# Patient Record
Sex: Male | Born: 1990 | Hispanic: Yes | Marital: Married | Smoking: Current every day smoker
Health system: Southern US, Community
[De-identification: ages and names within clinical notes are randomized; demographics above are authoritative.]

## PROBLEM LIST (undated history)

## (undated) DIAGNOSIS — L818 Other specified disorders of pigmentation: Secondary | ICD-10-CM

## (undated) DIAGNOSIS — J069 Acute upper respiratory infection, unspecified: Secondary | ICD-10-CM

## (undated) DIAGNOSIS — G8929 Other chronic pain: Secondary | ICD-10-CM

## (undated) DIAGNOSIS — F419 Anxiety disorder, unspecified: Secondary | ICD-10-CM

## (undated) DIAGNOSIS — Z8614 Personal history of Methicillin resistant Staphylococcus aureus infection: Secondary | ICD-10-CM

## (undated) DIAGNOSIS — Z9889 Other specified postprocedural states: Secondary | ICD-10-CM

## (undated) DIAGNOSIS — M4317 Spondylolisthesis, lumbosacral region: Secondary | ICD-10-CM

## (undated) DIAGNOSIS — F329 Major depressive disorder, single episode, unspecified: Secondary | ICD-10-CM

## (undated) DIAGNOSIS — K219 Gastro-esophageal reflux disease without esophagitis: Secondary | ICD-10-CM

## (undated) DIAGNOSIS — F259 Schizoaffective disorder, unspecified: Secondary | ICD-10-CM

## (undated) DIAGNOSIS — F32A Depression, unspecified: Secondary | ICD-10-CM

## (undated) DIAGNOSIS — F429 Obsessive-compulsive disorder, unspecified: Secondary | ICD-10-CM

## (undated) DIAGNOSIS — F250 Schizoaffective disorder, bipolar type: Secondary | ICD-10-CM

## (undated) DIAGNOSIS — M19032 Primary osteoarthritis, left wrist: Secondary | ICD-10-CM

## (undated) HISTORY — DX: Anxiety disorder, unspecified: F41.9

## (undated) HISTORY — PX: LASIK: SHX215

## (undated) HISTORY — PX: WRIST SURGERY: SHX841

## (undated) HISTORY — PX: INGUINAL HERNIA REPAIR: SHX194

---

## 2013-11-01 ENCOUNTER — Other Ambulatory Visit: Payer: Self-pay | Admitting: Orthopedic Surgery

## 2013-11-01 ENCOUNTER — Other Ambulatory Visit (HOSPITAL_COMMUNITY): Payer: Self-pay | Admitting: Orthopedic Surgery

## 2013-11-01 DIAGNOSIS — M19039 Primary osteoarthritis, unspecified wrist: Secondary | ICD-10-CM

## 2013-11-06 ENCOUNTER — Ambulatory Visit (HOSPITAL_COMMUNITY)
Admission: RE | Admit: 2013-11-06 | Discharge: 2013-11-06 | Disposition: A | Discharge status: Home / Self Care | Source: Ambulatory Visit | Attending: Orthopedic Surgery | Admitting: Orthopedic Surgery

## 2013-11-06 DIAGNOSIS — M21939 Unspecified acquired deformity of unspecified forearm: Secondary | ICD-10-CM | POA: Insufficient documentation

## 2013-11-06 DIAGNOSIS — M19239 Secondary osteoarthritis, unspecified wrist: Secondary | ICD-10-CM | POA: Insufficient documentation

## 2013-11-06 DIAGNOSIS — M19039 Primary osteoarthritis, unspecified wrist: Secondary | ICD-10-CM

## 2013-11-09 ENCOUNTER — Other Ambulatory Visit: Payer: Self-pay

## 2013-11-20 DIAGNOSIS — M19032 Primary osteoarthritis, left wrist: Secondary | ICD-10-CM

## 2013-11-20 HISTORY — DX: Primary osteoarthritis, left wrist: M19.032

## 2013-11-21 ENCOUNTER — Emergency Department (HOSPITAL_BASED_OUTPATIENT_CLINIC_OR_DEPARTMENT_OTHER)
Admission: EM | Admit: 2013-11-21 | Discharge: 2013-11-21 | Disposition: A | Discharge status: Other Acute Inpatient Hospital | Attending: Emergency Medicine | Admitting: Emergency Medicine

## 2013-11-21 ENCOUNTER — Encounter (HOSPITAL_COMMUNITY): Payer: Self-pay | Admitting: Behavioral Health

## 2013-11-21 ENCOUNTER — Encounter (HOSPITAL_BASED_OUTPATIENT_CLINIC_OR_DEPARTMENT_OTHER): Payer: Self-pay | Admitting: Emergency Medicine

## 2013-11-21 ENCOUNTER — Inpatient Hospital Stay (HOSPITAL_COMMUNITY)
Admission: AD | Admit: 2013-11-21 | Discharge: 2013-11-26 | DRG: 885 | Disposition: A | Discharge status: Home / Self Care | Source: Intra-hospital | Attending: Psychiatry | Admitting: Psychiatry

## 2013-11-21 DIAGNOSIS — Z79899 Other long term (current) drug therapy: Secondary | ICD-10-CM

## 2013-11-21 DIAGNOSIS — F429 Obsessive-compulsive disorder, unspecified: Secondary | ICD-10-CM | POA: Diagnosis present

## 2013-11-21 DIAGNOSIS — F329 Major depressive disorder, single episode, unspecified: Secondary | ICD-10-CM | POA: Diagnosis present

## 2013-11-21 DIAGNOSIS — F411 Generalized anxiety disorder: Secondary | ICD-10-CM | POA: Insufficient documentation

## 2013-11-21 DIAGNOSIS — T1491XA Suicide attempt, initial encounter: Secondary | ICD-10-CM

## 2013-11-21 DIAGNOSIS — S51809A Unspecified open wound of unspecified forearm, initial encounter: Secondary | ICD-10-CM | POA: Insufficient documentation

## 2013-11-21 DIAGNOSIS — X789XXA Intentional self-harm by unspecified sharp object, initial encounter: Secondary | ICD-10-CM | POA: Insufficient documentation

## 2013-11-21 DIAGNOSIS — F259 Schizoaffective disorder, unspecified: Secondary | ICD-10-CM | POA: Diagnosis present

## 2013-11-21 DIAGNOSIS — Z7289 Other problems related to lifestyle: Secondary | ICD-10-CM

## 2013-11-21 DIAGNOSIS — S41112A Laceration without foreign body of left upper arm, initial encounter: Secondary | ICD-10-CM

## 2013-11-21 DIAGNOSIS — F333 Major depressive disorder, recurrent, severe with psychotic symptoms: Principal | ICD-10-CM | POA: Diagnosis present

## 2013-11-21 DIAGNOSIS — F410 Panic disorder [episodic paroxysmal anxiety] without agoraphobia: Secondary | ICD-10-CM | POA: Diagnosis present

## 2013-11-21 DIAGNOSIS — Z87891 Personal history of nicotine dependence: Secondary | ICD-10-CM | POA: Insufficient documentation

## 2013-11-21 DIAGNOSIS — S41111A Laceration without foreign body of right upper arm, initial encounter: Secondary | ICD-10-CM

## 2013-11-21 DIAGNOSIS — F3289 Other specified depressive episodes: Secondary | ICD-10-CM | POA: Insufficient documentation

## 2013-11-21 HISTORY — DX: Schizoaffective disorder, unspecified: F25.9

## 2013-11-21 HISTORY — DX: Anxiety disorder, unspecified: F41.9

## 2013-11-21 HISTORY — DX: Schizoaffective disorder, bipolar type: F25.0

## 2013-11-21 HISTORY — DX: Major depressive disorder, single episode, unspecified: F32.9

## 2013-11-21 HISTORY — DX: Depression, unspecified: F32.A

## 2013-11-21 HISTORY — DX: Obsessive-compulsive disorder, unspecified: F42.9

## 2013-11-21 LAB — COMPREHENSIVE METABOLIC PANEL
Alkaline Phosphatase: 86 U/L (ref 39–117)
BUN: 17 mg/dL (ref 6–23)
CO2: 25 mEq/L (ref 19–32)
Chloride: 100 mEq/L (ref 96–112)
GFR calc Af Amer: >90 mL/min (ref >90)
Glucose, Bld: 134 mg/dL — ABNORMAL HIGH (ref 70–99)
Potassium: 4 mEq/L (ref 3.5–5.1)
Total Bilirubin: 0.3 mg/dL (ref 0.3–1.2)

## 2013-11-21 LAB — CBC WITH DIFFERENTIAL/PLATELET
Basophils Absolute: 0 10*3/uL (ref 0.0–0.1)
Basophils Relative: 0 % (ref 0–1)
Eosinophils Relative: 0 % (ref 0–5)
Lymphocytes Relative: 18 % (ref 12–46)
MCHC: 36.9 g/dL — ABNORMAL HIGH (ref 30.0–36.0)
Neutro Abs: 6.4 10*3/uL (ref 1.7–7.7)
Neutrophils Relative %: 74 % (ref 43–77)
Platelets: 234 10*3/uL (ref 150–400)
RDW: 11.7 % (ref 11.5–15.5)
WBC: 8.6 10*3/uL (ref 4.0–10.5)

## 2013-11-21 LAB — ETHANOL: Alcohol, Ethyl (B): <11 mg/dL (ref 0–11)

## 2013-11-21 LAB — RAPID URINE DRUG SCREEN, HOSP PERFORMED
Amphetamines: NOT DETECTED
Barbiturates: NOT DETECTED
Benzodiazepines: NOT DETECTED
Cocaine: NOT DETECTED

## 2013-11-21 MED ORDER — MAGNESIUM HYDROXIDE 400 MG/5ML PO SUSP: 30.0000 mL | Freq: Every day | ORAL | Status: DC | PRN

## 2013-11-21 MED ORDER — NAPROXEN SODIUM 275 MG PO TABS: 275.0000 mg | ORAL_TABLET | Freq: Three times a day (TID) | ORAL | Status: DC

## 2013-11-21 MED ORDER — ACETAMINOPHEN 325 MG PO TABS: 650.0000 mg | ORAL_TABLET | Freq: Four times a day (QID) | ORAL | Status: DC | PRN

## 2013-11-21 MED ORDER — NAPROXEN 250 MG PO TABS
250.0000 mg | ORAL_TABLET | Freq: Three times a day (TID) | ORAL | Status: DC
  Filled 2013-11-21 (×2): qty 1

## 2013-11-21 MED ORDER — RISPERIDONE 0.5 MG PO TBDP
0.5000 mg | ORAL_TABLET | Freq: Every day | ORAL | Status: DC
  Administered 2013-11-21: 0.5 mg via ORAL
  Filled 2013-11-21 (×4): qty 1

## 2013-11-21 MED ORDER — LORAZEPAM 1 MG PO TABS
1.0000 mg | ORAL_TABLET | Freq: Four times a day (QID) | ORAL | Status: DC | PRN
  Administered 2013-11-21 – 2013-11-26 (×16): 1 mg via ORAL
  Filled 2013-11-21 (×16): qty 1

## 2013-11-21 MED ORDER — LIDOCAINE-EPINEPHRINE (PF) 2 %-1:200000 IJ SOLN
20.0000 mL | Freq: Once | INTRAMUSCULAR | Status: AC
  Administered 2013-11-21: 20 mL via INTRADERMAL
  Filled 2013-11-21: qty 20

## 2013-11-21 MED ORDER — NICOTINE 14 MG/24HR TD PT24
14.0000 mg | MEDICATED_PATCH | Freq: Every day | TRANSDERMAL | Status: DC
  Administered 2013-11-21 – 2013-11-22 (×2): 14 mg via TRANSDERMAL
  Filled 2013-11-21 (×6): qty 1

## 2013-11-21 MED ORDER — ALUM & MAG HYDROXIDE-SIMETH 200-200-20 MG/5ML PO SUSP: 30.0000 mL | ORAL | Status: DC | PRN

## 2013-11-21 MED ORDER — NAPROXEN 250 MG PO TABS
250.0000 mg | ORAL_TABLET | Freq: Three times a day (TID) | ORAL | Status: DC
  Administered 2013-11-21 – 2013-11-25 (×9): 250 mg via ORAL
  Filled 2013-11-21 (×22): qty 1

## 2013-11-21 MED ORDER — ONDANSETRON HCL 8 MG PO TABS: 4.0000 mg | ORAL_TABLET | Freq: Three times a day (TID) | ORAL | Status: DC | PRN

## 2013-11-21 MED ORDER — LORAZEPAM 1 MG PO TABS: 1.0000 mg | ORAL_TABLET | Freq: Three times a day (TID) | ORAL | Status: DC | PRN

## 2013-11-21 MED ORDER — FLUOXETINE HCL 10 MG PO CAPS
10.0000 mg | ORAL_CAPSULE | Freq: Every day | ORAL | Status: DC
  Administered 2013-11-21 – 2013-11-22 (×2): 10 mg via ORAL
  Filled 2013-11-21 (×6): qty 1

## 2013-11-21 MED ORDER — ACETAMINOPHEN 325 MG PO TABS
650.0000 mg | ORAL_TABLET | ORAL | Status: DC | PRN
  Administered 2013-11-21: 650 mg via ORAL
  Filled 2013-11-21: qty 2

## 2013-11-21 MED ORDER — BUPROPION HCL 100 MG PO TABS
100.0000 mg | ORAL_TABLET | ORAL | Status: DC
  Administered 2013-11-22: 100 mg via ORAL
  Filled 2013-11-21 (×4): qty 1

## 2013-11-21 MED ORDER — IBUPROFEN 400 MG PO TABS: 600.0000 mg | ORAL_TABLET | Freq: Three times a day (TID) | ORAL | Status: DC | PRN

## 2013-11-21 NOTE — ED Notes (Addendum)
Lac not repaired by md had steri strips applied w non stick dressing and 2 inch roller gauze,  Brother at bedside

## 2013-11-21 NOTE — ED Notes (Signed)
Pt brought in by family w several lac to bilateral wrist,  Bleeding controlled  States he wanted to harm self

## 2013-11-21 NOTE — ED Notes (Signed)
Sitter at bedside.

## 2013-11-21 NOTE — ED Notes (Signed)
Called TTS states they have quite a few but will get back with [place] as quick as they can to give [place] a time for tele psych assessment Dr Rosalia Hammers notified of this

## 2013-11-21 NOTE — BHH Counselor (Signed)
Writer called and spoke w/ pt's RN Melissa and will conduct TA at 9:15 am. Writer then called EDP Bernette Mayers to discuss pt.   Evette Cristal, [place] Assessment Counselor

## 2013-11-21 NOTE — ED Notes (Signed)
RN at bedside drawing labs pts father and brother in the room as well pt calm and cooperative  Quiet and withdrawn

## 2013-11-21 NOTE — ED Notes (Signed)
Called Karl Cabrera [facility] to request a sitter she states she will try to rearrange and get one for [place] later this morning but will call if she can

## 2013-11-21 NOTE — ED Notes (Signed)
Pt speaking with psych for assessment via telepsych. Sitter remains at bedside.

## 2013-11-21 NOTE — ED Notes (Signed)
[name] at [facility] called stating she will send a per diem sitter at 0700 this am.
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
## 2013-11-21 NOTE — ED Notes (Signed)
Paige with BH called states she will do the tele psych at 9:15
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
## 2013-11-21 NOTE — ED Provider Notes (Signed)
CSN: [id number]     Arrival date & time 11/21/13  0451 History   First MD Initiated Contact with Patient 11/21/13 [id number]     Chief Complaint  Patient presents with  . Extremity Laceration   (Consider location/radiation/quality/duration/timing/severity/associated sxs/prior Treatment) HPI 22 year old male who presents today stating that he has a history of anxiety and depression and cut his wrist just prior to arrival in the shower. He has multiple lacerations to bilateral volar forearms. He states he did this with an exact a knife. He states he is in the shower and they were cleaned immediately. He states exact the knife was clean. He states he has had his tetanus immunizations and he had been in the service. He denies any history of suicidal attempt but has had some coughing episodes before. He states at that time he did this he was feeling like harming himself and was doing it to harm self. He states that he does not feel that way now and does not currently have any plan to harm self. He is under a psychiatrist's care and has a appointment with his mental health provider tomorrow. He has been coming off of Effexor as it was causing some side effects and has been starting Prozac. He denies any previous hospitalizations. Past Medical History  Diagnosis Date  . Anxiety   . Depressed   . Schizo-affective schizophrenia   . OCD (obsessive compulsive disorder)    No past surgical history on file. No family history on file. History  Substance Use Topics  . Smoking status: Former Games developer  . Smokeless tobacco: Former Neurosurgeon  . Alcohol Use: Yes    Review of Systems  All other systems reviewed and are negative.    Allergies  Codeine  Home Medications   Current Outpatient Rx  Name  Route  Sig  Dispense  Refill  . FLUoxetine (PROZAC) 10 MG capsule   Oral   Take 10 mg by mouth daily.          BP 145/95  Pulse 116  Temp(Src) 99.5 F (37.5 C) (Oral)  Resp 20  Ht 5\' 8"  (1.727 m)  Wt 135  lb (61.236 kg)  BMI 20.53 kg/m2  SpO2 99% Physical Exam  Nursing note and vitals reviewed. Constitutional: He is oriented to person, place, and time. He appears well-developed and well-nourished.  HENT:  Head: Normocephalic and atraumatic.  Right Ear: External ear normal.  Left Ear: External ear normal.  Nose: Nose normal.  Mouth/Throat: Oropharynx is clear and moist.  Eyes: Conjunctivae and EOM are normal. Pupils are equal, round, and reactive to light.  Neck: Normal range of motion. Neck supple.  Cardiovascular: Normal rate and normal heart sounds.   Pulmonary/Chest: Effort normal and breath sounds normal.  Abdominal: Soft. Bowel sounds are normal.  Musculoskeletal:       Arms: Multiple lacerations both superficial and deep. 7 lacerations which goes through to the cutaneous material. #1 as distal on the right forearm is approximately 2 cm #2 is the next proximal on the right forearm it is about 2 cm #4 is most proximal on the right forearm and is 4 cm on the left forearm there are 3 deep lacerations with the most distal being 2 cm one more proximal from that is 4 cm and the most proximal is 4 cm. He has full active range of motion of all of his flexors and extensors of his forearm and wrist sensation is intact bilaterally to his fingers and hands radial pulses  are intact.  Neurological: He is alert and oriented to person, place, and time. He has normal reflexes.  Skin: Skin is warm and dry.  Psychiatric: His speech is normal and behavior is normal. Judgment and thought content normal. His affect is blunt. Cognition and memory are normal.    ED Course  LACERATION REPAIR Date/Time: 11/21/2013 5:55 AM Performed by: Hilario Quarry Authorized by: Hilario Quarry Consent: Verbal consent obtained. Risks and benefits: risks, benefits and alternatives were discussed Patient identity confirmed: verbally with patient Time out: Immediately prior to procedure a "time out" was called to verify  the correct patient, procedure, equipment, support staff and site/side marked as required. Body area: upper extremity Location details: right lower arm Laceration length: 4 cm Foreign bodies: no foreign bodies Tendon involvement: none Nerve involvement: none Vascular damage: no Anesthesia: local infiltration Local anesthetic: lidocaine 1% with epinephrine Anesthetic total: 4 ml Patient sedated: no Preparation: Patient was prepped and draped in the usual sterile fashion. Irrigation solution: saline Irrigation method: syringe Amount of cleaning: standard Debridement: none Degree of undermining: none Skin closure: 5-0 Prolene Number of sutures: 6 Technique: simple Approximation: close Approximation difficulty: simple Dressing: 4x4 sterile gauze Patient tolerance: Patient tolerated the procedure well with no immediate complications.  LACERATION REPAIR Date/Time: 11/21/2013 5:57 AM Performed by: Hilario Quarry Authorized by: Hilario Quarry Consent: Verbal consent obtained. Risks and benefits: risks, benefits and alternatives were discussed Patient identity confirmed: verbally with patient Time out: Immediately prior to procedure a "time out" was called to verify the correct patient, procedure, equipment, support staff and site/side marked as required. Body area: upper extremity Location details: left lower arm Laceration length: 10 cm Foreign bodies: no foreign bodies Vascular damage: no Anesthesia: local infiltration Local anesthetic: lidocaine 1% with epinephrine Anesthetic total: 5 ml Patient sedated: no Preparation: Patient was prepped and draped in the usual sterile fashion. Irrigation solution: saline Irrigation method: syringe Amount of cleaning: standard Debridement: none Degree of undermining: none Skin closure: 5-0 Prolene Number of sutures: 16 Technique: running Approximation: close Approximation difficulty: simple Dressing: 4x4 sterile gauze    (including critical care time)   Labs Review Labs Reviewed  URINE RAPID DRUG SCREEN (HOSP PERFORMED)  CBC WITH DIFFERENTIAL  COMPREHENSIVE METABOLIC PANEL  ETHANOL   Imaging Review No results found.  EKG Interpretation   None      .lac  MDM  No diagnosis found. Patient with history of depression and became suicidal knife and cut his wrist. He states he currently does not have a plan and does not wish to harm himself at this moment. Medical clearance labs are being obtained and he will require a psychiatric evaluation. He voices understanding of this however if he decides to leave prior to this I would advised commitment. I have discussed wound care with he and his family and they understand that the sutures should remit be removed in about 8 days.  Awaiting psych evaluation.  Care signed out to Dr. Bernette Mayers.   Hilario Quarry, MD 11/21/13 [id number]

## 2013-11-21 NOTE — ED Notes (Signed)
Spoke with Jamesetta So at [facility] for transport to [facility]

## 2013-11-21 NOTE — BHH Counselor (Signed)
Pt has been accepted to [facility], 504-1 by Shelda Jakes PA-C to Dr. Elsie Saas. Writer notified EDP Bernette Mayers re: pt's acceptance. Writer spoke w/ pt's RN Melissa who will have pt sign support paperwork and fax back. Original support paperwork will be transferred w/ pt.    Evette Cristal, [place] Assessment Counselor

## 2013-11-21 NOTE — ED Notes (Signed)
Family at bedside watching tv with pt. Pt reports pain to his wrist, rates at 7/10. Pt is smiling in nad.

## 2013-11-21 NOTE — Progress Notes (Signed)
Adult Psychoeducational Group Note  Date:  11/21/2013 Time:  8:00 pm  Group Topic/Focus:  Wrap-Up Group:   The focus of this group is to help patients review their daily goal of treatment and discuss progress on daily workbooks.  Participation Level:  Active  Participation Quality:  Appropriate and Sharing  Affect:  Appropriate  Cognitive:  Appropriate  Insight: Appropriate  Engagement in Group:  Engaged  Modes of Intervention:  Discussion, Education, Socialization and Support  Additional Comments:  Pt stated that he is in the hospital for an attempted suicide. Pt stated that he is good at making people laugh and his always there for others.   Laural Benes, Blaike Vickers 11/21/2013, 9:13 PM

## 2013-11-21 NOTE — Tx Team (Signed)
Initial Interdisciplinary Treatment Plan  PATIENT STRENGTHS: (choose at least two) Ability for insight Active sense of humor Motivation for treatment/growth  PATIENT STRESSORS: Marital or family conflict Medication change or noncompliance   PROBLEM LIST: Problem List/Patient Goals Date to be addressed Date deferred Reason deferred Estimated date of resolution  Med adjustment 11/21/13     hallucinations 11/21/13     depression                                           DISCHARGE CRITERIA:  Ability to meet basic life and health needs Adequate post-discharge living arrangements Improved stabilization in mood, thinking, and/or behavior Verbal commitment to aftercare and medication compliance  PRELIMINARY DISCHARGE PLAN: Attend aftercare/continuing care group Attend PHP/IOP  PATIENT/FAMIILY INVOLVEMENT: This treatment plan has been presented to and reviewed with the patient, Karl Cabrera, and/or family member. The patient and family have been given the opportunity to ask questions and make suggestions.  Blanca Thornton L 11/21/2013, 3:49 PM
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
## 2013-11-22 DIAGNOSIS — F429 Obsessive-compulsive disorder, unspecified: Secondary | ICD-10-CM | POA: Diagnosis present

## 2013-11-22 DIAGNOSIS — F333 Major depressive disorder, recurrent, severe with psychotic symptoms: Principal | ICD-10-CM | POA: Diagnosis present

## 2013-11-22 DIAGNOSIS — F410 Panic disorder [episodic paroxysmal anxiety] without agoraphobia: Secondary | ICD-10-CM

## 2013-11-22 DIAGNOSIS — F4310 Post-traumatic stress disorder, unspecified: Secondary | ICD-10-CM

## 2013-11-22 DIAGNOSIS — F411 Generalized anxiety disorder: Secondary | ICD-10-CM | POA: Diagnosis present

## 2013-11-22 DIAGNOSIS — R45851 Suicidal ideations: Secondary | ICD-10-CM

## 2013-11-22 DIAGNOSIS — X789XXA Intentional self-harm by unspecified sharp object, initial encounter: Secondary | ICD-10-CM

## 2013-11-22 DIAGNOSIS — F332 Major depressive disorder, recurrent severe without psychotic features: Secondary | ICD-10-CM

## 2013-11-22 DIAGNOSIS — T1491XA Suicide attempt, initial encounter: Secondary | ICD-10-CM | POA: Diagnosis present

## 2013-11-22 MED ORDER — RISPERIDONE 1 MG PO TBDP
1.0000 mg | ORAL_TABLET | Freq: Every day | ORAL | Status: DC
  Administered 2013-11-22 – 2013-11-25 (×4): 1 mg via ORAL
  Filled 2013-11-22 (×6): qty 1

## 2013-11-22 MED ORDER — FLUOXETINE HCL 20 MG PO CAPS
20.0000 mg | ORAL_CAPSULE | Freq: Every day | ORAL | Status: DC
  Administered 2013-11-23 – 2013-11-26 (×4): 20 mg via ORAL
  Filled 2013-11-22 (×6): qty 1

## 2013-11-22 NOTE — BHH Suicide Risk Assessment (Signed)
Suicide Risk Assessment  Admission Assessment     Nursing information obtained from:  Patient Demographic factors:  Male;Caucasian;Unemployed Current Mental Status:  Suicidal ideation indicated by patient;Suicide plan Loss Factors:  Loss of significant relationship Historical Factors:  Prior suicide attempts Risk Reduction Factors:  Positive social support;Living with another person, especially a relative  CLINICAL FACTORS:   Severe Anxiety and/or Agitation Panic Attacks Bipolar Disorder:   Depressive phase Depression:   Hopelessness Impulsivity Recent sense of peace/wellbeing Severe Obsessive-Compulsive Disorder More than one psychiatric diagnosis Previous Psychiatric Diagnoses and Treatments Medical Diagnoses and Treatments/Surgeries  COGNITIVE FEATURES THAT CONTRIBUTE TO RISK:  Closed-mindedness Loss of executive function Polarized thinking Thought constriction (tunnel vision)    SUICIDE RISK:   Moderate:  Frequent suicidal ideation with limited intensity, and duration, some specificity in terms of plans, no associated intent, good self-control, limited dysphoria/symptomatology, some risk factors present, and identifiable protective factors, including available and accessible social support.  PLAN OF CARE: Admitted voluntarily on emergently to [facility] emergency department for depression, panic episode and self-injurious behaviors. Patient needed crisis stabilization, safety margin and medication management.  I certify that inpatient services furnished can reasonably be expected to improve the patient's condition.  Karl Cabrera,JANARDHAHA R. 11/22/2013, 12:21 PM
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
## 2013-11-22 NOTE — [facility] Group Notes (Signed)
[facility] LCSW Group Therapy  Emotional Regulation 1:15 - 2: 30 PM        11/22/2013  2:40 PM   Type of Therapy:  Group Therapy  Participation Level:  Appropriate  Participation Quality:  Appropriate  Affect:  Appropriate  Cognitive:   Appropriate  Insight:  Developing/Improving Engaged  Engagement in Therapy:  Developing/Improving Engaged  Modes of Intervention:  Discussion Exploration Problem-Solving Supportive  Summary of Progress/Problems:  Group topic was emotional regulations.  Patient participated in the discussion and was able to identify an emotion that needed to regulated.  He talked about having problems with anxiety and not feeling safe to leave home. Patient was able to identify approprite coping skills.  Wynn Banker 11/22/2013 2:40 PM

## 2013-11-22 NOTE — Progress Notes (Signed)
Patient ID: Karl Cabrera, male   DOB: 1991-02-09, 22 y.o.   MRN: [id number]  Patient presents with depressed mood and affect. Patient denies SI/HI but endorses A/V hallucinations and states, "I hear and see things sometimes." Patient c/o pain in his bilateral wrists due to cutting and his lower back. Patient is given scheduled Naproxen but states, "it doesn't help." Patient is minimal with Clinical research associate and forwards little.  Patient rates his depression at a 4/10 and his hopelessness at a 10/10 today.   A: Writer gave patient encouragement and support. Before lunch writer changed patient's bandages and checks sites for any sign of infection. Patient's cuts were sutured but patient was picking at one suture. No sign of infection was noted by Clinical research associate.  R: Patient is receptive and cooperative with Clinical research associate. Patient has no questions or concerns at this time. Q15 minute safety checks are maintained and patient is safe at this time.
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
## 2013-11-22 NOTE — Clinical Social Work Note (Signed)
Writer spoke with patient's brother to provide SPE.  Brother is not concerned that patient would harm himself immediately upon discharge but has long term fears for his safety if things do not change.  He shared patient spends all his time at home and has not been seeing a psychiatrist or counselor.  He was informed we have scheduled patient with out outpatient clinic for medication management and counseling. He was also advised writer will follow up with patient regarding MH-IOP.  Brother was pleased as he and father had been looking in to a rehab program for patient.  Writer spoke with patient who is agreeable to MH-IOP.  Writer left for outpatient CM to contact writer with a start date for next week. 
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
## 2013-11-22 NOTE — Progress Notes (Signed)
Patient ID: Karl Cabrera, male   DOB: 1991/01/23, 22 y.o.   MRN: [id number]  D:  Pt stated his day his day was "going good so far".  When asked the circumstances surrounding his adm pt stated that he was in the house playing video games. Stated he started having a panic attack and can't remember what happened after. Stated he woke up and had already cut both his wrists. Pt denied SI during the assessment.  A:  Support and encouragement was offered. 15 min checks continued for safety.  R: Pt remains safe.

## 2013-11-22 NOTE — [facility] Suicide Risk Assessment (Signed)
[facility] INPATIENT:  Family/Significant Other Suicide Prevention Education  Suicide Prevention Education:  Education Completed; Jemario Poitras, Brother, [phone];  has been identified by the patient as the family member/significant other with whom the patient will be residing, and identified as the person(s) who will aid the patient in the event of a mental health crisis (suicidal ideations/suicide attempt).  With written consent from the patient, the family member/significant other has been provided the following suicide prevention education, prior to the and/or following the discharge of the patient.  The suicide prevention education provided includes the following:  Suicide risk factors  Suicide prevention and interventions  National Suicide Hotline telephone number  [facility] assessment telephone number  [facility] Emergency Assistance 911  [facility] and/or Residential Mobile Crisis Unit telephone number  Request made of family/significant other to:  Remove weapons (e.g., guns, rifles, knives), all items previously/currently identified as safety concern.  Brother advised guns are being removed from the home.  Remove drugs/medications (over-the-counter, prescriptions, illicit drugs), all items previously/currently identified as a safety concern.  The family member/significant other verbalizes understanding of the suicide prevention education information provided.  The family member/significant other agrees to remove the items of safety concern listed above.  Wynn Banker 11/22/2013, 2:36 PM

## 2013-11-22 NOTE — Progress Notes (Signed)
[facility] Group Notes:  (Nursing/MHT/Case Management/Adjunct)  Date:  11/22/2013  Time:  9:17 PM  Type of Therapy:  Group Therapy  Participation Level:  Active  Participation Quality:  Appropriate  Affect:  Appropriate  Cognitive:  Appropriate  Insight:  Appropriate  Engagement in Group:  Engaged  Modes of Intervention:  Discussion  Summary of Progress/Problems:The patient was recently arriving to [facility] and said he has had a good day.The patient was apppropriate and engage in group.  Octavio Manns 11/22/2013, 9:17 PM

## 2013-11-22 NOTE — Progress Notes (Signed)
Adult Psychoeducational Group Note  Date:  11/22/2013 Time:  11:00am Group Topic/Focus:  Personal Choices and Values:   The focus of this group is to help patients assess and explore the importance of values in their lives, how their values affect their decisions, how they express their values and what opposes their expression.  Participation Level:  Minimal  Participation Quality:  Appropriate and Attentive  Affect:  Appropriate  Cognitive:  Alert and Appropriate  Insight: Appropriate  Engagement in Group:  Limited  Modes of Intervention:  Discussion and Education  Additional Comments: Pt attended and participated in group. Discussion was on personal development. Question was asked what does personal development mean to you. Pt statted personal development means learning how to deny my urges.  Shelly Bombard D 11/22/2013, 1:39 PM

## 2013-11-22 NOTE — [facility] Group Notes (Signed)
[facility] LCSW Aftercare Discharge Planning Group Note   11/22/2013 11:17 AM    Participation Quality:  Appropraite  Mood/Affect:  Appropriate  Depression Rating:  5  Anxiety Rating:  4  Thoughts of Suicide:  No  Will you contract for safety?   NA  Current AVH:  No  Plan for Discharge/Comments:  Patient attended discharge planning group and actively participated in group.  Patient reports he has not been seen by an outpatient provider since discharging from the [employer] in August of this year.  He is agreeable to follow up with [facility].  CSW provided all participants with daily workbook.   Transportation Means: Patient has transportation.   Supports:  Patient has a support system.   Sevyn Markham, [name]

## 2013-11-23 DIAGNOSIS — F333 Major depressive disorder, recurrent, severe with psychotic symptoms: Principal | ICD-10-CM

## 2013-11-23 MED ORDER — NICOTINE 21 MG/24HR TD PT24
21.0000 mg | MEDICATED_PATCH | Freq: Every day | TRANSDERMAL | Status: DC
Start: 2013-11-23 — End: 2013-11-24
  Administered 2013-11-23 – 2013-11-24 (×2): 21 mg via TRANSDERMAL
  Filled 2013-11-23 (×3): qty 1

## 2013-11-23 NOTE — Progress Notes (Signed)
Adult Psychoeducational Group Note  Date:  11/23/2013 Time:  2:59 PM  Group Topic/Focus:  Overcoming Stress:   The focus of this group is to define stress and help patients assess their triggers.  Participation Level:  Active  Participation Quality:  Appropriate  Affect:  Appropriate  Cognitive:  Appropriate  Insight: Appropriate  Engagement in Group:  Engaged, Lacking and Poor  Modes of Intervention:  Discussion, Education, Socialization and Support  Additional Comments:  Pts discussed stressors in their life and how their body responds to these stressors. Pt stated his stressor is when people don't listen. Pt stated cleaning his house has worked in the past with his stress and that he would like to try scuba diving.  Karl Cabrera 11/23/2013, 2:59 PM

## 2013-11-23 NOTE — Progress Notes (Signed)
D: Pt verbalizes anxiety this evening. Pt's anxiety was decreased with 1 mg of Ativan. Pt reports this drug as effective in managing his anxiety. Pt has been observed interacting appropriately within the milieu. Pt attended group with active participation.  A: Writer administered scheduled and prn medications to pt. Continued support and availability as needed was extended to this pt. Staff continue to monitor pt with q43min checks.  R: No adverse drug reactions noted. Pt receptive to treatment. Pt remains safe at this time.

## 2013-11-23 NOTE — Progress Notes (Signed)
Patient ID: Karl Cabrera, male   DOB: 11-07-1991, 22 y.o.   MRN: [id number]  Thursday 9 AM Group  The focus of this group is to educate the patient on the purpose and policies of crisis stabilization and provide a format to answer questions about their admission.  The group details unit policies and expectations of patients while admitted.  Patient did not attend.

## 2013-11-23 NOTE — Progress Notes (Signed)
[facility] Group Notes:  (Nursing/MHT/Case Management/Adjunct)  Date:  11/23/2013  Time:  2:58 PM  Type of Therapy:  Therapeutic Activity  Participation Level:  Active  Participation Quality:  Appropriate, Attentive and Supportive  Affect:  Appropriate  Cognitive:  Appropriate  Insight:  Appropriate  Engagement in Group:  Engaged and Supportive  Modes of Intervention:  Activity  Summary of Progress/Problems: Pts played a game of Pictionary using coping skills. Pt participated and was engaged.  Caswell Corwin 11/23/2013, 2:58 PM

## 2013-11-23 NOTE — Progress Notes (Signed)
D: Pt is anxious in affect and mood. Pt endorses anxiety at a level six out of ten. Pt's anxiety has been effectively controlled with Ativan. Pt is currently at [employer]. Pt observed interacting appropriately within the milieu.  A: Writer administered scheduled and prn medications to pt. Continued support and availability as needed was extended to this pt. Staff continue to monitor pt with q22min checks.  R: No adverse drug reactions noted. Pt receptive to treatment. Pt remains safe at this time.

## 2013-11-23 NOTE — [facility] Group Notes (Signed)
[facility] LCSW Group Therapy  [facility] 1:15 - 2:30 PM  11/23/2013   Type of Therapy:  Group Therapy  Participation Level: Active  Participation Quality:  Attentive  Affect:  Appropriate  Cognitive:  Appropriate  Insight:  Developing/Improving and Engaged  Engagement in Therapy:  Developing/Improving Engaged  Modes of Intervention:  Discussion, Education, Exploration, Problem-Solving, Rapport Building, Support   Summary of Progress/Problems:  Patient listened attentively to speaker from [employer] [facility].  Patient shared he plans to take advantage of the services offered by [facility].  He advised Clinical research associate after group that he contacted his brother regarding the Family and Friends Support Group.  Wynn Banker 11/23/2013

## 2013-11-23 NOTE — Progress Notes (Addendum)
Patient ID: Karl Cabrera, male   DOB: [birth year], 22 y.o.   MRN: [id number]  D: Patient presents with appropriate to circumstance affect and anxious mood. Patient denies SI/HI and A/V hallucinations. Patient rates his pain at a 4/10 which is his pain goal for the day. Patient requested writer to change his bandages around his wrists. Patient rated his depression at a 4/10 and his hopelessness at a 1/10 today. Patient reports on his daily inventory sheet that after discharge he will, "get on a better schedule."  A: Writer gave encouragement to patient and support. Writer gave patient alcohol swabs to clean affected area, then writer wrapped patient's bilateral wrists. Writer did not note any signs of infection.  R: Patient is cooperative and pleasant with staff but is minimal in interaction. Patient is seen in the milieu periodically. Q15 minute safety checks are maintained.

## 2013-11-23 NOTE — Progress Notes (Signed)
[facility] MD Progress Note  11/23/2013 3:44 PM Karl Cabrera  MRN:  [id number] Subjective:  Karl Cabrera states he is doing ok today. He is sleeping fair, eating well and notes that his depression is 4/10.  His anxiety is moderate. He is eating well. Objective: Well spoken male who is alert oriented and cooperative. He is informative and congenial. Denies SI/HI or AVH. Diagnosis:   DSM5: ADL's:  Intact  Sleep: Fair  Appetite:  Good  Suicidal Ideation:  decreased Homicidal Ideation:  denies AEB (as evidenced by):  Psychiatric Specialty Exam: Review of Systems  Constitutional: Negative.  Negative for fever, chills, weight loss, malaise/fatigue and diaphoresis.  HENT: Negative for congestion and sore throat.   Eyes: Negative for blurred vision, double vision and photophobia.  Respiratory: Negative for cough, shortness of breath and wheezing.   Cardiovascular: Negative for chest pain, palpitations and PND.  Gastrointestinal: Negative for heartburn, nausea, vomiting, abdominal pain, diarrhea and constipation.  Musculoskeletal: Negative for falls, joint pain and myalgias.  Neurological: Negative for dizziness, tingling, tremors, sensory change, speech change, focal weakness, seizures, loss of consciousness, weakness and headaches.  Endo/Heme/Allergies: Negative for polydipsia. Does not bruise/bleed easily.  Psychiatric/Behavioral: Negative for depression, suicidal ideas, hallucinations, memory loss and substance abuse. The patient is not nervous/anxious and does not have insomnia.     Blood pressure 126/86, pulse 118, temperature 97.8 F (36.6 C), temperature source Oral, resp. rate 18, height 5\' 7"  (1.702 m), weight 62.596 kg (138 lb).Body mass index is 21.61 kg/(m^2).  General Appearance: Casual  Eye Contact::  Good  Speech:  Clear and Coherent  Volume:  Normal  Mood:  Anxious and Dysphoric  Affect:  Congruent  Thought Process:  Coherent  Orientation:  Full (Time, Place, and Person)   Thought Content:  WDL  Suicidal Thoughts:  Yes.  without intent/plan  Homicidal Thoughts:  No  Memory:  NA  Judgement:  Poor  Insight:  Shallow  Psychomotor Activity:  Normal  Concentration:  Fair  Recall:  Fair  Akathisia:  No  Handed:  Right  AIMS (if indicated):     Assets:  Communication Skills Desire for Improvement Housing Physical Health  Sleep:  Number of Hours: 6.5   Current Medications: Current Facility-Administered Medications  Medication Dose Route Frequency Provider Last Rate Last Dose  . acetaminophen (TYLENOL) tablet 650 mg  650 mg Oral Q6H PRN Verne Spurr, PA-C      . alum & mag hydroxide-simeth (MAALOX/MYLANTA) 200-200-20 MG/5ML suspension 30 mL  30 mL Oral Q4H PRN Verne Spurr, PA-C      . FLUoxetine (PROZAC) capsule 20 mg  20 mg Oral Daily Nehemiah Settle, MD   20 mg at 11/23/13 0748  . LORazepam (ATIVAN) tablet 1 mg  1 mg Oral Q6H PRN Verne Spurr, PA-C   1 mg at 11/23/13 1317  . magnesium hydroxide (MILK OF MAGNESIA) suspension 30 mL  30 mL Oral Daily PRN Verne Spurr, PA-C      . naproxen (NAPROSYN) tablet 250 mg  250 mg Oral TID WC Nehemiah Settle, MD   250 mg at 11/23/13 1143  . nicotine (NICODERM CQ - dosed in mg/24 hours) patch 21 mg  21 mg Transdermal Daily Nehemiah Settle, MD   21 mg at 11/23/13 7829  . risperiDONE (RISPERDAL M-TABS) disintegrating tablet 1 mg  1 mg Oral QHS Nehemiah Settle, MD   1 mg at 11/22/13 2115    Lab Results: No results found for this or any previous visit (  from the past 48 hour(s)).  Physical Findings: AIMS: Facial and Oral Movements Muscles of Facial Expression: None, normal Lips and Perioral Area: None, normal Jaw: None, normal Tongue: None, normal,Extremity Movements Upper (arms, wrists, hands, fingers): None, normal Lower (legs, knees, ankles, toes): None, normal, Trunk Movements Neck, shoulders, hips: None, normal, Overall Severity Severity of abnormal movements (highest  score from questions above): None, normal Incapacitation due to abnormal movements: None, normal Patient's awareness of abnormal movements (rate only patient's report): No Awareness, Dental Status Current problems with teeth and/or dentures?: No Does patient usually wear dentures?: No  CIWA:    COWS:     Treatment Plan Summary: Daily contact with patient to assess and evaluate symptoms and progress in treatment Medication management  Plan: 1. Continue crisis management and stabilization. 2. Medication management to reduce current symptoms to base line and improve patient's overall level of functioning 3. Treat health problems as indicated. 4. Develop treatment plan to decrease risk of relapse upon discharge and the need for     readmission. 5. Psycho-social education regarding relapse prevention and self care. 6. Health care follow up as needed for medical problems. 7. Continue home medications where appropriate. 8. Disposition in progress.   Medical Decision Making Problem Points:  Established problem, stable/improving (1) Data Points:  Review or order medicine tests (1)  I certify that inpatient services furnished can reasonably be expected to improve the patient's condition.   MASHBURN,NEIL 11/23/2013, 3:44 PM  Reviewed the information documented and agree with the treatment plan.  Koury Roddy,JANARDHAHA R. 11/23/2013 6:13 PM

## 2013-11-24 MED ORDER — NICOTINE POLACRILEX 2 MG MT GUM
CHEWING_GUM | OROMUCOSAL | Status: AC
  Filled 2013-11-24: qty 1

## 2013-11-24 MED ORDER — NICOTINE POLACRILEX 2 MG MT GUM
2.0000 mg | CHEWING_GUM | OROMUCOSAL | Status: DC | PRN
  Administered 2013-11-24 – 2013-11-25 (×2): 2 mg via ORAL
  Filled 2013-11-24: qty 1

## 2013-11-24 NOTE — Progress Notes (Signed)
Adult Psychoeducational Group Note  Date:  11/24/2013 Time:  10:59 PM  Group Topic/Focus:  Wrap-Up Group:   The focus of this group is to help patients review their daily goal of treatment and discuss progress on daily workbooks.  Participation Level:  Active  Participation Quality:  Appropriate  Affect:  Appropriate  Cognitive:  Alert  Insight: Appropriate  Engagement in Group:  Engaged  Modes of Intervention:  Discussion  Additional Comments:  Pt rated his day today a "4". He revealed that he was excited to go home today but some reason that didn't happen. He seemed to be in a good mood during group but he also joked around with another patient while others were talking in the group. Other than that he was alert and engaged during the discussion.  [place] Shi K 11/24/2013, 10:59 PM

## 2013-11-24 NOTE — [facility] Group Notes (Signed)
[facility] LCSW Aftercare Discharge Planning Group Note   11/24/2013 12:53 PM    Participation Quality:  Appropraite  Mood/Affect:  Appropriate  Depression Rating:  3  Anxiety Rating:  5  Thoughts of Suicide:  No  Will you contract for safety?   NA  Current AVH:  No  Plan for Discharge/Comments:  Patient attended discharge planning group and actively participated in group.  Patient to follow up with [facility].  CSW provided all participants with daily workbook.   Transportation Means: Patient has transportation.   Supports:  Patient has a support system.   [name], [name]

## 2013-11-24 NOTE — Tx Team (Signed)
Interdisciplinary Treatment Plan Update   Date Reviewed:  11/24/2013  Time Reviewed:  10:25 AM  Progress in Treatment:   Attending groups: Yes Participating in groups: Yes Taking medication as prescribed: Yes  Tolerating medication: Yes Family/Significant other contact made: No, collateral contact made with brother Patient understands diagnosis: Yes  Discussing patient identified problems/goals with staff: Yes Medical problems stabilized or resolved: Yes Denies suicidal/homicidal ideation: Yes Patient has not harmed self or others: Yes  For review of initial/current patient goals, please see plan of care.  Estimated Length of Stay:  3-5 days  Reasons for Continued Hospitalization:  Anxiety Depression Medication stabilization Suicidal ideation  New Problems/Goals identified:    Discharge Plan or Barriers:   Home with outpatient follow up to be scheduled with [facility]  Additional Comments:  N/A  Attendees:  Patient:  11/24/2013 10:25 AM   Signature: Mervyn Gay, MD 11/24/2013 10:25 AM  Signature:  Verne Spurr, PA 11/24/2013 10:25 AM  Signature: Jonne Ply, RN 11/24/2013 10:25 AM  Signature: 11/24/2013 10:25 AM  Signature:   11/24/2013 10:25 AM  Signature:  Horace Porteous Shawnn Bouillon, LCSW 11/24/2013 10:25 AM  Signature:  Reyes Ivan, LCSW 11/24/2013 10:25 AM  Signature:  11/24/2013 10:25 AM  Signature:   11/24/2013 10:25 AM  Signature: 11/24/2013  10:25 AM  Signature:   Onnie Boer, RN URCM 11/24/2013  10:25 AM  Signature:   11/24/2013  10:25 AM    Scribe for Treatment Team:   Juline Patch,  11/24/2013 10:25 AM

## 2013-11-24 NOTE — Progress Notes (Signed)
D: Patient denies SI at this time, despite his self inflicted injuries PTA.  Pt. Has an anxious mood and affect.  Pt. Limited and guarded with conversation but appears to be minimizing his situation. Despite his denial of SI, patient asked to contract for safety and agrees.  A: Patient given emotional support from RN. Patient encouraged to come to staff with concerns and/or questions. Patient's medication routine continued. Patient's orders and plan of care reviewed.   R: Patient remains appropriate and cooperative. Will continue to monitor patient q15 minutes for safety.

## 2013-11-24 NOTE — Progress Notes (Signed)
Writer has observed patient up in the dayroom laughing and joking with select peers. Patient attended group this evening and participated. Patient had no goal and is hopeful to discharge soon. Patient requested a prn dose of ativan after group and c/o back pain but refused heat pack and tylenol and reported  "none of that stuff worksAir cabin crew encouraged patient to let me know if wanting to try either of these. Safety maintained on unit with 15 min checks and patient currently denies si/hi/a/v hallucinations.

## 2013-11-24 NOTE — Progress Notes (Signed)
Patient ID: Karl Cabrera, male   DOB: 08/12/1991, 22 y.o.   MRN: [id number] [facility] [place] MD Progress Note  11/24/2013 3:57 PM [name]  MRN:  [id number] Subjective:  Patient is doing well and reports no new symptoms. He states he is eating well and sleeping well and attending groups also. He states his medication is helping and he reports no new side effects. We did discuss at length his social anxiety as well as his symptoms. He felt that he is schizophrenic and is fearful of this.  He has heard voices since he was a child. On further discussion the voices were likely dreams as they occurred only at night, only were things that he was frightened of "monsters and zombies".  He also discussed visual hallucinations which again on further discussion were not actual hallucinations but seeing an object in the distance, identifying it as one thing based on the shape, then discovering that it was actually something else when he was close enough to correctly identify it. More of a visual misperception rather than a true hallucination.      He denies having anyone in the family who is schizophrenic, but did have a maternal uncle who killed his wife, as well as a Emergency planning/management officer and then turned the gun on himself. Laquan thought he may have been a schizophrenic but states he was never diagnosed. Diagnosis:   DSM5: ADL's:  Intact  Sleep: Fair  Appetite:  Good  Suicidal Ideation:  decreased Homicidal Ideation:  denies AEB (as evidenced by):  Psychiatric Specialty Exam: Review of Systems  Constitutional: Negative.  Negative for fever, chills, weight loss, malaise/fatigue and diaphoresis.  HENT: Negative for congestion and sore throat.   Eyes: Negative for blurred vision, double vision and photophobia.  Respiratory: Negative for cough, shortness of breath and wheezing.   Cardiovascular: Negative for chest pain, palpitations and PND.  Gastrointestinal: Negative for heartburn, nausea, vomiting, abdominal  pain, diarrhea and constipation.  Musculoskeletal: Negative for falls, joint pain and myalgias.  Neurological: Negative for dizziness, tingling, tremors, sensory change, speech change, focal weakness, seizures, loss of consciousness, weakness and headaches.  Endo/Heme/Allergies: Negative for polydipsia. Does not bruise/bleed easily.  Psychiatric/Behavioral: Negative for depression, suicidal ideas, hallucinations, memory loss and substance abuse. The patient is not nervous/anxious and does not have insomnia.     Blood pressure 128/85, pulse 116, temperature 98.5 F (36.9 C), temperature source Oral, resp. rate 17, height 5\' 7"  (1.702 m), weight 62.596 kg (138 lb).Body mass index is 21.61 kg/(m^2).  General Appearance: Casual  Eye Contact::  Good  Speech:  Clear and Coherent  Volume:  Normal  Mood:  Anxious and Dysphoric  Affect:  Congruent  Thought Process:  Coherent  Orientation:  Full (Time, Place, and Person)  Thought Content:  WDL  Suicidal Thoughts:  Yes.  without intent/plan  Homicidal Thoughts:  No  Memory:  NA  Judgement:  Poor  Insight:  Shallow  Psychomotor Activity:  Normal  Concentration:  Fair  Recall:  Fair  Akathisia:  No  Handed:  Right  AIMS (if indicated):     Assets:  Communication Skills Desire for Improvement Housing Physical Health  Sleep:  Number of Hours: 5.5   Current Medications: Current Facility-Administered Medications  Medication Dose Route Frequency Provider Last Rate Last Dose  . acetaminophen (TYLENOL) tablet 650 mg  650 mg Oral Q6H PRN Verne Spurr, PA-C      . alum & mag hydroxide-simeth (MAALOX/MYLANTA) 200-200-20 MG/5ML suspension 30 mL  30 mL  Oral Q4H PRN Verne Spurr, PA-C      . FLUoxetine (PROZAC) capsule 20 mg  20 mg Oral Daily Nehemiah Settle, MD   20 mg at 11/24/13 0752  . LORazepam (ATIVAN) tablet 1 mg  1 mg Oral Q6H PRN Verne Spurr, PA-C   1 mg at 11/24/13 1454  . magnesium hydroxide (MILK OF MAGNESIA) suspension 30 mL   30 mL Oral Daily PRN Verne Spurr, PA-C      . naproxen (NAPROSYN) tablet 250 mg  250 mg Oral TID WC Nehemiah Settle, MD   250 mg at 11/24/13 0649  . nicotine (NICODERM CQ - dosed in mg/24 hours) patch 21 mg  21 mg Transdermal Daily Nehemiah Settle, MD   21 mg at 11/24/13 0753  . risperiDONE (RISPERDAL M-TABS) disintegrating tablet 1 mg  1 mg Oral QHS Nehemiah Settle, MD   1 mg at 11/23/13 2233    Lab Results: No results found for this or any previous visit (from the past 48 hour(s)).  Physical Findings: AIMS: Facial and Oral Movements Muscles of Facial Expression: None, normal Lips and Perioral Area: None, normal Jaw: None, normal Tongue: None, normal,Extremity Movements Upper (arms, wrists, hands, fingers): None, normal Lower (legs, knees, ankles, toes): None, normal, Trunk Movements Neck, shoulders, hips: None, normal, Overall Severity Severity of abnormal movements (highest score from questions above): None, normal Incapacitation due to abnormal movements: None, normal Patient's awareness of abnormal movements (rate only patient's report): No Awareness, Dental Status Current problems with teeth and/or dentures?: No Does patient usually wear dentures?: No  CIWA:    COWS:     Treatment Plan Summary: Daily contact with patient to assess and evaluate symptoms and progress in treatment Medication management  Plan: 1. Continue crisis management and stabilization. 2. Medication management to reduce current symptoms to base line and improve patient's overall level of functioning 3. Treat health problems as indicated. 4. Develop treatment plan to decrease risk of relapse upon discharge and the need for     readmission. 5. Psycho-social education regarding relapse prevention and self care. 6. Health care follow up as needed for medical problems. 7. Continue home medications where appropriate. 8. Will decrease the ativan to 0.5 TID prn for anxiety with a  goal to d/c this entirely. 9. Strongly feel that Tabb will benefit from the weekend groups provided by Sharee Pimple and [facility] RNs on the [place] and encouraged him to attend. 10. Disposition in progress.   Medical Decision Making Problem Points:  Established problem, stable/improving (1) Data Points:  Review or order medicine tests (1)  I certify that inpatient services furnished can reasonably be expected to improve the patient's condition.  Rona Ravens. Sherryn Pollino RPAC 4:10 PM 11/24/2013

## 2013-11-24 NOTE — [facility] Group Notes (Signed)
[facility] LCSW Group Therapy  11/24/2013 2:22 PM  Type of Therapy:  Group Therapy  Participation Level:  Did Not Attend   Wynn Banker 11/24/2013, 2:22 PM

## 2013-11-25 NOTE — Progress Notes (Signed)
D. Pt has been up and has been visible in milieu this evening, has been attending and participating in various activities. Pt has not verbalized any complaints this evening, spoke about how he is feeling better and spoke about how he anticipates discharge tomorrow afternoon. Pt spoke about wanting to go to college as well as get a job. A. Support and encouragement provided. R. Safety maintained, will continue to monitor.

## 2013-11-25 NOTE — [facility] Group Notes (Signed)
[facility] Group Notes:  (Clinical Social Work)  11/25/2013   3:00-4:00PM  Summary of Progress/Problems:   The main focus of today's process group was for the patient to identify ways in which they have sabotaged their own mental health wellness/recovery.  Motivational interviewing was used to explore the reasons they engage in this behavior, and reasons they may have for wanting to change.  The Stages of Change were explained to the group using a handout, and patients identified where they are with regard to changing self-defeating behaviors.  The patient expressed that he is in the hospital for a suicide attempt, and stated that he slit his wrists.  His self-sabotaging behavior shared today was self-injury, and he stated it makes him feel "self-worth" as long as he can still feel the pain.  He acknowledged several elements which were problems in the self-injurious behavior, including that the self-worth does not last, and that he may accidentally cut too deep and kill himself.  He feels he is in the Preparation Stage of Change.  After group, he asked CSW to make sure appointments for follow-up are set for him, because he is requesting discharge for tomorrow.  He is going to be picked up by father and brother, will be living with the two of them.  Type of Therapy:  Process Group  Participation Level:  Active  Participation Quality:  Attentive and Sharing  Affect:  Flat  Cognitive:  Appropriate and Oriented  Insight:  Developing/Improving  Engagement in Therapy:  Engaged  Modes of Intervention:  Education, Motivational Interviewing   Ambrose Mantle, LCSW 11/25/2013, 4:00pm

## 2013-11-25 NOTE — Progress Notes (Addendum)
Pt stated he is to go home today. He does not endorse any SI or HI and does contract for safety. Pt has lacerations to both wrist which appear without reddness or drainage. Pt has been going to group and has been participating. Pt does minimize his lacerations and his previous suicide attempts. Pt did not want any pain medication for his wrist pain. He has been very active in group and has been attending -He is very pleasant and cooperative.

## 2013-11-25 NOTE — Progress Notes (Signed)
[facility] MD Progress Note  11/25/2013 2:44 PM Karl Cabrera  MRN:  [id number] Subjective:  Patient's sleep was "fine" and appetite is "great", depression is low with no suicidal ideations, anxiety is "manageable" Diagnosis:   DSM5:  Depressive Disorders:  Major Depressive Disorder - Severe (296.23)  Axis I: Anxiety Disorder NOS, Major Depression, Recurrent severe and Obsessive Compulsive Disorder Axis II: Deferred Axis III:  Past Medical History  Diagnosis Date  . Anxiety   . Depressed   . Schizo-affective schizophrenia   . OCD (obsessive compulsive disorder)    Axis IV: other psychosocial or environmental problems, problems related to social environment and problems with primary support group Axis V: 41-50 serious symptoms  ADL's:  Intact  Sleep: Good  Appetite:  Good  Suicidal Ideation:  Denies Homicidal Ideation:  Denies  Psychiatric Specialty Exam: Review of Systems  Constitutional: Negative.   HENT: Negative.   Eyes: Negative.   Respiratory: Negative.   Cardiovascular: Negative.   Gastrointestinal: Negative.   Genitourinary: Negative.   Musculoskeletal: Negative.   Skin: Negative.   Neurological: Negative.   Endo/Heme/Allergies: Negative.   Psychiatric/Behavioral: Positive for depression.    Blood pressure 125/87, pulse 92, temperature 97.9 F (36.6 C), temperature source Oral, resp. rate 14, height 5\' 7"  (1.702 m), weight 62.596 kg (138 lb).Body mass index is 21.61 kg/(m^2).  General Appearance: Casual  Eye Contact::  Fair  Speech:  Normal Rate  Volume:  Normal  Mood:  Anxious and Depressed  Affect:  Congruent  Thought Process:  Coherent  Orientation:  Full (Time, Place, and Person)  Thought Content:  WDL  Suicidal Thoughts:  No  Homicidal Thoughts:  No  Memory:  Immediate;   Fair Recent;   Fair Remote;   Fair  Judgement:  Fair  Insight:  Fair  Psychomotor Activity:  Normal  Concentration:  Fair  Recall:  Fair  Akathisia:  No  Handed:  Right   AIMS (if indicated):     Assets:  Desire for Improvement Resilience Social Support  Sleep:  Number of Hours: 5.5   Current Medications: Current Facility-Administered Medications  Medication Dose Route Frequency Provider Last Rate Last Dose  . acetaminophen (TYLENOL) tablet 650 mg  650 mg Oral Q6H PRN Verne Spurr, PA-C      . alum & mag hydroxide-simeth (MAALOX/MYLANTA) 200-200-20 MG/5ML suspension 30 mL  30 mL Oral Q4H PRN Verne Spurr, PA-C      . FLUoxetine (PROZAC) capsule 20 mg  20 mg Oral Daily Nehemiah Settle, MD   20 mg at 11/25/13 0800  . LORazepam (ATIVAN) tablet 1 mg  1 mg Oral Q6H PRN Verne Spurr, PA-C   1 mg at 11/25/13 1249  . magnesium hydroxide (MILK OF MAGNESIA) suspension 30 mL  30 mL Oral Daily PRN Verne Spurr, PA-C      . naproxen (NAPROSYN) tablet 250 mg  250 mg Oral TID WC Nehemiah Settle, MD   250 mg at 11/25/13 0616  . nicotine polacrilex (NICORETTE) gum 2 mg  2 mg Oral PRN Nehemiah Settle, MD   2 mg at 11/24/13 2133  . risperiDONE (RISPERDAL M-TABS) disintegrating tablet 1 mg  1 mg Oral QHS Nehemiah Settle, MD   1 mg at 11/24/13 2100    Lab Results: No results found for this or any previous visit (from the past 48 hour(s)).  Physical Findings: AIMS: Facial and Oral Movements Muscles of Facial Expression: None, normal Lips and Perioral Area: None, normal Jaw: None, normal Tongue:  None, normal,Extremity Movements Upper (arms, wrists, hands, fingers): None, normal Lower (legs, knees, ankles, toes): None, normal, Trunk Movements Neck, shoulders, hips: None, normal, Overall Severity Severity of abnormal movements (highest score from questions above): None, normal Incapacitation due to abnormal movements: None, normal Patient's awareness of abnormal movements (rate only patient's report): No Awareness, Dental Status Current problems with teeth and/or dentures?: No Does patient usually wear dentures?: No  CIWA:     COWS:     Treatment Plan Summary: Daily contact with patient to assess and evaluate symptoms and progress in treatment Medication management  Plan:  Review of chart, vital signs, medications, and notes. 1-Individual and group therapy 2-Medication management for depression and anxiety:  Medications reviewed with the patient and he stated no untoward effects, no changes made 3-Coping skills for depression, anxiety 4-Continue crisis stabilization and management 5-Address health issues--monitoring vital signs, stable 6-Treatment plan in progress to prevent relapse of depression and anxiety  Medical Decision Making Problem Points:  Established problem, stable/improving (1) and Review of psycho-social stressors (1) Data Points:  Review of medication regiment & side effects (2)  I certify that inpatient services furnished can reasonably be expected to improve the patient's condition.   Nanine Means, PMH-NP   11/25/2013, 2:44 PM

## 2013-11-25 NOTE — Progress Notes (Signed)
Adult Psychoeducational Group Note  Date:  11/25/2013 Time:  8:00 pm  Group Topic/Focus:  Wrap-Up Group:   The focus of this group is to help patients review their daily goal of treatment and discuss progress on daily workbooks.  Participation Level:  Active  Participation Quality:  Appropriate and Sharing  Affect:  Appropriate  Cognitive:  Appropriate  Insight: Appropriate  Engagement in Group:  Engaged  Modes of Intervention:  Discussion, Education, Socialization and Support  Additional Comments:  Pt stated he is the hospital for an attempted suicide. Pt stated that he has learned coping skills that consist of counting to 10 and taking deep breaths.  Karl Cabrera 11/25/2013, 11:12 PM

## 2013-11-25 NOTE — Progress Notes (Signed)
Adult Psychoeducational Group Note  Date:  11/25/2013 Time:  3:56 PM  Group Topic/Focus:  Healthy Communication:   The focus of this group is to discuss communication, barriers to communication, as well as healthy ways to communicate with others.  Participation Level:  Active  Participation Quality:  Appropriate  Affect:  Appropriate  Cognitive:  Appropriate  Insight: Appropriate  Engagement in Group:  Engaged  Modes of Intervention:  Activity and Discussion  Additional Comments:  Pt actively participated in group.   Tora Perches N 11/25/2013, 3:56 PM

## 2013-11-25 NOTE — [facility] Group Notes (Signed)
[facility] Group Notes:  (Nursing/MHT/Case Management/Adjunct)  Date:  11/25/2013  Time:  2:30 PM  Type of Therapy:  Nurse Education  Participation Level:  Active  Participation Quality:  Appropriate  Affect:  Appropriate  Cognitive:  Appropriate  Insight:  Appropriate  Engagement in Group:  Engaged  Modes of Intervention:  Discussion  Summary of Progress/Problems:Pt stated he was trying to get ready for discharge today  Nicole Cella 11/25/2013, 2:30 PM

## 2013-11-26 DIAGNOSIS — F411 Generalized anxiety disorder: Secondary | ICD-10-CM

## 2013-11-26 DIAGNOSIS — F259 Schizoaffective disorder, unspecified: Secondary | ICD-10-CM

## 2013-11-26 MED ORDER — LORAZEPAM 1 MG PO TABS: 1.0000 mg | ORAL_TABLET | Freq: Four times a day (QID) | ORAL | Status: AC | PRN

## 2013-11-26 MED ORDER — RISPERIDONE 1 MG PO TBDP: 1.0000 mg | ORAL_TABLET | Freq: Every day | ORAL | Status: DC

## 2013-11-26 MED ORDER — MIRTAZAPINE 15 MG PO TABS: 15.0000 mg | ORAL_TABLET | Freq: Every day | ORAL | Status: DC

## 2013-11-26 MED ORDER — FLUOXETINE HCL 20 MG PO CAPS: 20.0000 mg | ORAL_CAPSULE | ORAL | Status: DC

## 2013-11-26 MED ORDER — NICOTINE POLACRILEX 2 MG MT GUM
CHEWING_GUM | OROMUCOSAL | Status: AC
  Filled 2013-11-26: qty 1

## 2013-11-26 NOTE — Progress Notes (Signed)
[facility] Adult Case Management Discharge Plan :  Will you be returning to the same living situation after discharge: No.  Will live with brother and father At discharge, do you have transportation home?:Yes,  father and brother to pick up Do you have the ability to pay for your medications:Yes,  no issues  Release of information consent forms completed and in the chart;  Patient's signature needed at discharge.  Patient to Follow up at: Follow-up Information   Follow up with Heron Nay - [facility] On 12/08/2013. (You are scheduled with Forde Radon on Friday, December 08, 2013 at [facility]    Contact information:   [address] [place], [place]   [address]  [phone]      Follow up with Dr. Daleen Bo - [facility] On 01/04/2014. (You are scheduled with Dr. Daleen Bo on Thursday, January 04, 2013 at 10 AM)    Contact information:   [address] [place], [place]   [address]  [phone]      Patient denies SI/HI:   Yes,  Adam[place]    Safety Planning and Suicide Prevention discussed:  Yes,  with weekday CSW  Sarina Ser 11/26/2013, 1:43 PM

## 2013-11-26 NOTE — Progress Notes (Signed)
Nrsg DC  Pt denies SI, HI, and / or presence of audit, vis, tactile halluc. He states he is ready for DC and that " I'm glad I got to go to your group today".  Pt given DC AVS instructions, he states he understands and will comply . He is given prescriptions as well as samples of meds from pharmacy . He completes his AM self inventory rates his depression and hopelessness "2/1". Pt  Is given all belongings previously locked in hi lockwer and then escorted to bldg entrance and DC'd ambulatory to brother's care.

## 2013-11-26 NOTE — [facility] Group Notes (Signed)
[facility] Group Notes: (Clinical Social Work)   11/26/2013      Type of Therapy:  Group Therapy   Participation Level:  Did Not Attend    Ambrose Mantle, LCSW 11/26/2013, 4:41 PM
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
## 2013-11-26 NOTE — [facility] Suicide Risk Assessment (Signed)
Suicide Risk Assessment  Discharge Assessment     Demographic Factors:  Male  Mental Status Per Nursing Assessment::   On Admission:  Suicidal ideation indicated by patient;Suicide plan  Current Mental Status by Physician: Alert oriented, cooperative. Improved insight. Not suicidal. No delusions.  Loss Factors: Decrease in vocational status and Financial problems/change in socioeconomic status  Historical Factors: Impulsivity  Risk Reduction Factors:   Sense of responsibility to family, Positive social support and Positive coping skills or problem solving skills  Continued Clinical Symptoms:  Dysthymia Obsessive-Compulsive Disorder  Cognitive Features That Contribute To Risk:  Closed-mindedness Polarized thinking    Suicide Risk:  Minimal: No identifiable suicidal ideation.  Patients presenting with no risk factors but with morbid ruminations; may be classified as minimal risk based on the severity of the depressive symptoms  Discharge Diagnoses:   AXIS I:  Generalized Anxiety Disorder, Obsessive Compulsive Disorder and Panic Disorder, Major depressive disorder, unspecified AXIS II:  Cluster B Traits AXIS III:   Past Medical History  Diagnosis Date  . Anxiety   . Depressed   . Schizo-affective schizophrenia   . OCD (obsessive compulsive disorder)    AXIS IV:  other psychosocial or environmental problems and problems related to social environment AXIS V:  51-60 moderate symptoms  Plan Of Care/Follow-up recommendations:  Activity:  as tolerated Diet:  regular See Discharge summary for details. Continue Prozac, ativan and risperidone. Follow up as scheduled. Is patient on multiple antipsychotic therapies at discharge:  No   Has Patient had three or more failed trials of antipsychotic monotherapy by history:  No  Recommended Plan for Multiple Antipsychotic Therapies: NA  Mataya Kilduff 11/26/2013, 9:52 AM

## 2013-11-29 NOTE — Progress Notes (Signed)
Patient Discharge Instructions:  Next Level Care Provider Has Access to the EMR, 11/29/13 Records provided to [facility] Outpatient clinic via [facility]/Epic access.  Jerelene Redden, 11/29/2013, 3:04 PM

## 2013-12-08 ENCOUNTER — Ambulatory Visit (HOSPITAL_COMMUNITY): Payer: Self-pay | Admitting: Psychology

## 2013-12-12 ENCOUNTER — Other Ambulatory Visit: Payer: Self-pay | Admitting: Orthopedic Surgery

## 2013-12-19 ENCOUNTER — Encounter (HOSPITAL_BASED_OUTPATIENT_CLINIC_OR_DEPARTMENT_OTHER): Payer: Self-pay | Admitting: *Deleted

## 2013-12-25 NOTE — H&P (Signed)
Karl Cabrera is an 23 y.o. male.   Chief Complaint: c/o painful left wrist HPI:   Karl Cabrera is a 23 year-old right-hand dominant recently retired from the [employer] and now studying at [employer].  He presents for evaluation of a painful left wrist following a significant injury sustained March 21, 2013 while on active duty [place] [employer] [place].  At that time he fell ten feet onto an outstretched left wrist.  He was seen by the orthopaedic physicians in the [employer] and had x-rays which revealed a comminuted fracture of the distal radius.  He was initially advised to consider surgery.  Ultimately it was determined that he would benefit from closed treatment.    He completed his immobilization and went on to therapy.  He has had marked restriction in wrist range of motion and pain with dorsiflexion and extreme palmar flexion since the injury.  He denies significant numbness.  He cannot work out or do significant exertion with load-bearing on the left due to his level of pain. He now seeks an upper extremity orthopaedic consult.      Past Medical History  Diagnosis Date  . Anxiety   . Depressed   . Schizo-affective schizophrenia   . OCD (obsessive compulsive disorder)   . 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 Acid reflux     occasional - no current med.  Marland Kitchen. Spondylolisthesis at L5-S1 level     previous back injury    Past Surgical History  Procedure Laterality Date  . Inguinal hernia repair  age 64 years    History reviewed. No pertinent family history. Social History:  reports that he has been smoking Cigarettes.  He has been smoking about 0.00 packs per day for the past 6 years. He has never used smokeless tobacco. He reports that he drinks alcohol. He reports that he does not use illicit drugs.  Allergies:  Allergies  Allergen Reactions  . Codeine Rash    AS A CHILD    No prescriptions prior to admission    No results found for this or any  previous visit (from the past 48 hour(s)).  No results found.   Pertinent items are noted in HPI.  Height 5' 7.5" (1.715 m), weight 62.596 kg (138 lb).  General appearance: alert Head: Normocephalic, without obvious abnormality Neck: supple, symmetrical, trachea midline Resp: clear to auscultation bilaterally Cardio: regular rate and rhythm GI: normal findings: bowel sounds normal Extremities:.  Inspection of his hand and wrist reveals a mild malunion appearance of the left wrist. His range of motion reveals palmar flexion right wrist 80, left wrist 40 limited by pain, dorsiflexion right wrist 80, and left wrist 40 with severe pain. He has 15 degrees radial deviation right vs. 4 left; 40 degrees ulnar deviation vs. 20 left.  He has full range of motion of his fingers and thumb.  His neurovascular examination is intact.    RADIOGRAPHS:   Four views of the left wrist demonstrate irregularity of the lunate facet of the distal radius, loss of radial slope and length and ventral tilt.   He has a slightly abnormal appearance of scapholunate articulation.  He has a slight VISI posture of his lunate and an ununited ulnar styloid fracture.  There is some narrowing at the distal radial ulnar joint.    Comparison films of the right wrist were reviewed.  These are remarkable for a slight VISI posture of his right lunate.    MRI left  wrist: This was accomplished at [facility] on 11/06/13.  It was interpreted by Dr. Jena Gauss.  He is noted to have secondary arthrosis due to focal cartilage loss at the articular surface fracture in the lunate facet.  There is a 1.2 mm.  step-off.     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    Plan: To the OR for left wrist scope  with arthrodesis of radio-lunate and scapho-lunate and resection of distal pole of the scaphoid. The procedure, risks,benefits and post-op course were discussed with the patient at length and they were in agreement with the plan.  DASNOIT,Chante Mayson J 12/25/2013, 12:34 PM H&P documentation: 12/26/2013  -History and Physical Reviewed  -Patient has been re-examined  -No change in the plan of care  Wyn Forster, MD

## 2013-12-26 ENCOUNTER — Encounter (HOSPITAL_BASED_OUTPATIENT_CLINIC_OR_DEPARTMENT_OTHER): Admitting: Anesthesiology

## 2013-12-26 ENCOUNTER — Ambulatory Visit (HOSPITAL_BASED_OUTPATIENT_CLINIC_OR_DEPARTMENT_OTHER)
Admission: RE | Admit: 2013-12-26 | Discharge: 2013-12-26 | Disposition: A | Discharge status: Home / Self Care | Source: Ambulatory Visit | Attending: Orthopedic Surgery | Admitting: Orthopedic Surgery

## 2013-12-26 ENCOUNTER — Encounter (HOSPITAL_BASED_OUTPATIENT_CLINIC_OR_DEPARTMENT_OTHER)
Admission: RE | Disposition: A | Discharge status: Home / Self Care | Payer: Self-pay | Source: Ambulatory Visit | Attending: Orthopedic Surgery

## 2013-12-26 ENCOUNTER — Encounter (HOSPITAL_BASED_OUTPATIENT_CLINIC_OR_DEPARTMENT_OTHER): Payer: Self-pay | Admitting: *Deleted

## 2013-12-26 ENCOUNTER — Ambulatory Visit (HOSPITAL_BASED_OUTPATIENT_CLINIC_OR_DEPARTMENT_OTHER): Admitting: Anesthesiology

## 2013-12-26 DIAGNOSIS — F411 Generalized anxiety disorder: Secondary | ICD-10-CM | POA: Insufficient documentation

## 2013-12-26 DIAGNOSIS — F429 Obsessive-compulsive disorder, unspecified: Secondary | ICD-10-CM | POA: Insufficient documentation

## 2013-12-26 DIAGNOSIS — IMO0002 Reserved for concepts with insufficient information to code with codable children: Secondary | ICD-10-CM | POA: Insufficient documentation

## 2013-12-26 DIAGNOSIS — Q762 Congenital spondylolisthesis: Secondary | ICD-10-CM | POA: Insufficient documentation

## 2013-12-26 DIAGNOSIS — F259 Schizoaffective disorder, unspecified: Secondary | ICD-10-CM | POA: Insufficient documentation

## 2013-12-26 DIAGNOSIS — F3289 Other specified depressive episodes: Secondary | ICD-10-CM | POA: Insufficient documentation

## 2013-12-26 DIAGNOSIS — K219 Gastro-esophageal reflux disease without esophagitis: Secondary | ICD-10-CM | POA: Insufficient documentation

## 2013-12-26 DIAGNOSIS — F329 Major depressive disorder, single episode, unspecified: Secondary | ICD-10-CM | POA: Insufficient documentation

## 2013-12-26 DIAGNOSIS — M19039 Primary osteoarthritis, unspecified wrist: Secondary | ICD-10-CM | POA: Insufficient documentation

## 2013-12-26 HISTORY — DX: Gastro-esophageal reflux disease without esophagitis: K21.9

## 2013-12-26 HISTORY — DX: Primary osteoarthritis, left wrist: M19.032

## 2013-12-26 HISTORY — PX: WRIST ARTHROSCOPY: SHX838

## 2013-12-26 HISTORY — DX: Spondylolisthesis, lumbosacral region: M43.17

## 2013-12-26 HISTORY — PX: WRIST FUSION: SHX839

## 2013-12-26 LAB — POCT HEMOGLOBIN-HEMACUE: Hemoglobin: 15.6 g/dL (ref 13.0–17.0)

## 2013-12-26 SURGERY — ARTHROSCOPY, WRIST
Anesthesia: General | Site: Wrist | Laterality: Left

## 2013-12-26 MED ORDER — FENTANYL CITRATE 0.05 MG/ML IJ SOLN: 50.0000 ug | INTRAMUSCULAR | Status: DC | PRN

## 2013-12-26 MED ORDER — LIDOCAINE HCL (CARDIAC) 20 MG/ML IV SOLN
INTRAVENOUS | Status: DC | PRN
  Administered 2013-12-26: 100 mg via INTRAVENOUS

## 2013-12-26 MED ORDER — FENTANYL CITRATE 0.05 MG/ML IJ SOLN
INTRAMUSCULAR | Status: AC
  Filled 2013-12-26: qty 2

## 2013-12-26 MED ORDER — DEXAMETHASONE SODIUM PHOSPHATE 10 MG/ML IJ SOLN
INTRAMUSCULAR | Status: DC | PRN
  Administered 2013-12-26: 10 mg via INTRAVENOUS
  Administered 2013-12-26: 4 mg

## 2013-12-26 MED ORDER — MIDAZOLAM HCL 2 MG/2ML IJ SOLN
INTRAMUSCULAR | Status: AC
  Filled 2013-12-26: qty 2

## 2013-12-26 MED ORDER — LACTATED RINGERS IV SOLN
INTRAVENOUS | Status: DC
  Administered 2013-12-26 (×2): via INTRAVENOUS
  Administered 2013-12-26: 20 mL/h via INTRAVENOUS

## 2013-12-26 MED ORDER — MIDAZOLAM HCL 2 MG/2ML IJ SOLN: 1.0000 mg | INTRAMUSCULAR | Status: DC | PRN

## 2013-12-26 MED ORDER — CEFAZOLIN SODIUM-DEXTROSE 2-3 GM-% IV SOLR
2.0000 g | Freq: Once | INTRAVENOUS | Status: AC
  Administered 2013-12-26: 2 g via INTRAVENOUS

## 2013-12-26 MED ORDER — ONDANSETRON HCL 4 MG/2ML IJ SOLN
INTRAMUSCULAR | Status: DC | PRN
Start: 2013-12-26 — End: 2013-12-26
  Administered 2013-12-26: 4 mg via INTRAVENOUS

## 2013-12-26 MED ORDER — MIDAZOLAM HCL 2 MG/2ML IJ SOLN
1.0000 mg | INTRAMUSCULAR | Status: DC | PRN
  Administered 2013-12-26: 2 mg via INTRAVENOUS

## 2013-12-26 MED ORDER — HYDROMORPHONE HCL PF 1 MG/ML IJ SOLN: 0.2500 mg | INTRAMUSCULAR | Status: DC | PRN

## 2013-12-26 MED ORDER — FENTANYL CITRATE 0.05 MG/ML IJ SOLN
INTRAMUSCULAR | Status: AC
  Filled 2013-12-26: qty 4

## 2013-12-26 MED ORDER — MIDAZOLAM HCL 2 MG/ML PO SYRP: 12.0000 mg | ORAL_SOLUTION | Freq: Once | ORAL | Status: DC | PRN

## 2013-12-26 MED ORDER — CEFAZOLIN SODIUM 1-5 GM-% IV SOLN
INTRAVENOUS | Status: AC
  Filled 2013-12-26: qty 100

## 2013-12-26 MED ORDER — HYDROMORPHONE HCL 2 MG PO TABS: ORAL_TABLET | ORAL | Status: DC

## 2013-12-26 MED ORDER — CEPHALEXIN 500 MG PO CAPS: 500.0000 mg | ORAL_CAPSULE | Freq: Three times a day (TID) | ORAL | Status: DC

## 2013-12-26 MED ORDER — CHLORHEXIDINE GLUCONATE 4 % EX LIQD: 60.0000 mL | Freq: Once | CUTANEOUS | Status: DC

## 2013-12-26 MED ORDER — FENTANYL CITRATE 0.05 MG/ML IJ SOLN
50.0000 ug | Freq: Once | INTRAMUSCULAR | Status: AC
  Administered 2013-12-26: 100 ug via INTRAVENOUS

## 2013-12-26 MED ORDER — BUPIVACAINE-EPINEPHRINE PF 0.5-1:200000 % IJ SOLN
INTRAMUSCULAR | Status: DC | PRN
  Administered 2013-12-26: 25 mL

## 2013-12-26 MED ORDER — MIDAZOLAM HCL 5 MG/5ML IJ SOLN
INTRAMUSCULAR | Status: DC | PRN
  Administered 2013-12-26: 2 mg via INTRAVENOUS

## 2013-12-26 MED ORDER — PROPOFOL 10 MG/ML IV BOLUS
INTRAVENOUS | Status: DC | PRN
  Administered 2013-12-26: 200 mg via INTRAVENOUS

## 2013-12-26 MED ORDER — PROMETHAZINE HCL 25 MG/ML IJ SOLN: 6.2500 mg | INTRAMUSCULAR | Status: DC | PRN

## 2013-12-26 SURGICAL SUPPLY — 110 items
BAG DECANTER FOR FLEXI CONT (MISCELLANEOUS) IMPLANT
BANDAGE ADH SHEER 1  50/CT (GAUZE/BANDAGES/DRESSINGS) IMPLANT
BANDAGE ELASTIC 3 VELCRO ST LF (GAUZE/BANDAGES/DRESSINGS) ×6 IMPLANT
BANDAGE ELASTIC 4 VELCRO ST LF (GAUZE/BANDAGES/DRESSINGS) ×3 IMPLANT
BIT DRILL MEMOFIX 1.7 (BIT) ×3 IMPLANT
BLADE AVERAGE 25MMX9MM (BLADE) ×1
BLADE AVERAGE 25X9 (BLADE) ×2 IMPLANT
BLADE MINI RND TIP GREEN BEAV (BLADE) ×3 IMPLANT
BLADE SURG 15 STRL LF DISP TIS (BLADE) ×3 IMPLANT
BLADE SURG 15 STRL SS (BLADE) ×6
BNDG COHESIVE 3X5 TAN STRL LF (GAUZE/BANDAGES/DRESSINGS) IMPLANT
BNDG COHESIVE 4X5 TAN STRL (GAUZE/BANDAGES/DRESSINGS) ×3 IMPLANT
BNDG ESMARK 4X9 LF (GAUZE/BANDAGES/DRESSINGS) ×3 IMPLANT
BNDG GAUZE ELAST 4 BULKY (GAUZE/BANDAGES/DRESSINGS) IMPLANT
BRUSH SCRUB EZ PLAIN DRY (MISCELLANEOUS) ×3 IMPLANT
BUR CUDA 2.9 (BURR) IMPLANT
BUR CUDA 2.9MM (BURR)
BUR EGG/OVAL CARBIDE (BURR) IMPLANT
BUR FULL RADIUS 2.9 (BURR) IMPLANT
BUR FULL RADIUS 2.9MM (BURR)
BUR GATOR 2.9 (BURR) IMPLANT
BUR GATOR 2.9MM (BURR)
BUR SPHERICAL 2.9 (BURR) IMPLANT
BUR SPHERICAL 2.9MM (BURR)
CANISTER SUCT 1200ML W/VALVE (MISCELLANEOUS) IMPLANT
CLOSURE WOUND 1/2 X4 (GAUZE/BANDAGES/DRESSINGS) ×1
CORDS BIPOLAR (ELECTRODE) ×3 IMPLANT
COVER MAYO STAND STRL (DRAPES) ×3 IMPLANT
COVER TABLE BACK 60X90 (DRAPES) ×3 IMPLANT
CUFF TOURNIQUET SINGLE 18IN (TOURNIQUET CUFF) ×3 IMPLANT
DECANTER SPIKE VIAL GLASS SM (MISCELLANEOUS) IMPLANT
DRAPE EXTREMITY T 121X128X90 (DRAPE) ×3 IMPLANT
DRAPE OEC MINIVIEW 54X84 (DRAPES) ×3 IMPLANT
DRAPE SURG 17X23 STRL (DRAPES) ×3 IMPLANT
DRSG EMULSION OIL 3X3 NADH (GAUZE/BANDAGES/DRESSINGS) IMPLANT
GAUZE XEROFORM 1X8 LF (GAUZE/BANDAGES/DRESSINGS) IMPLANT
GLOVE BIO SURGEON STRL SZ 6.5 (GLOVE) ×4 IMPLANT
GLOVE BIO SURGEON STRL SZ7 (GLOVE) ×3 IMPLANT
GLOVE BIO SURGEONS STRL SZ 6.5 (GLOVE) ×2
GLOVE BIOGEL M STRL SZ7.5 (GLOVE) ×3 IMPLANT
GLOVE BIOGEL PI IND STRL 7.0 (GLOVE) ×2 IMPLANT
GLOVE BIOGEL PI IND STRL 7.5 (GLOVE) ×1 IMPLANT
GLOVE BIOGEL PI INDICATOR 7.0 (GLOVE) ×4
GLOVE BIOGEL PI INDICATOR 7.5 (GLOVE) ×2
GLOVE ORTHO TXT STRL SZ7.5 (GLOVE) ×3 IMPLANT
GOWN STRL REUS W/ TWL LRG LVL3 (GOWN DISPOSABLE) ×3 IMPLANT
GOWN STRL REUS W/TWL LRG LVL3 (GOWN DISPOSABLE) ×6
GOWN STRL REUS W/TWL XL LVL3 (GOWN DISPOSABLE) ×6 IMPLANT
IV NS 1000ML (IV SOLUTION) ×2
IV NS 1000ML BAXH (IV SOLUTION) ×1 IMPLANT
IV NS 500ML (IV SOLUTION)
IV NS 500ML BAXH (IV SOLUTION) IMPLANT
IV NS IRRIG 3000ML ARTHROMATIC (IV SOLUTION) IMPLANT
K-WIRE .035X4 (WIRE) ×3 IMPLANT
K-WIRE .045X4 (WIRE) IMPLANT
K-WIRE .062X4 (WIRE) IMPLANT
LOOP VESSEL MAXI BLUE (MISCELLANEOUS) IMPLANT
NDL SAFETY ECLIPSE 18X1.5 (NEEDLE) ×1 IMPLANT
NEEDLE 27GAX1X1/2 (NEEDLE) IMPLANT
NEEDLE HYPO 18GX1.5 SHARP (NEEDLE) ×2
NEEDLE HYPO 22GX1.5 SAFETY (NEEDLE) ×3 IMPLANT
NEEDLE MAYO 6 CRC TAPER PT (NEEDLE) IMPLANT
NEEDLE TUOHY 20GX3.5 (NEEDLE) IMPLANT
NS IRRIG 1000ML POUR BTL (IV SOLUTION) IMPLANT
PACK BASIN DAY SURGERY FS (CUSTOM PROCEDURE TRAY) ×3 IMPLANT
PAD CAST 3X4 CTTN HI CHSV (CAST SUPPLIES) ×2 IMPLANT
PADDING CAST ABS 4INX4YD NS (CAST SUPPLIES)
PADDING CAST ABS COTTON 4X4 ST (CAST SUPPLIES) IMPLANT
PADDING CAST COTTON 3X4 STRL (CAST SUPPLIES) ×4
PASSER SUT SWANSON 36MM LOOP (INSTRUMENTS) IMPLANT
PIN MEMOFIX 107MM (PIN) ×6 IMPLANT
SET SM JOINT TUBING/CANN (CANNULA) ×3 IMPLANT
SLEEVE SCD COMPRESS KNEE MED (MISCELLANEOUS) ×3 IMPLANT
SPLINT PLASTER CAST XFAST 3X15 (CAST SUPPLIES) ×5 IMPLANT
SPLINT PLASTER XTRA FASTSET 3X (CAST SUPPLIES) ×10
SPONGE GAUZE 4X4 12PLY (GAUZE/BANDAGES/DRESSINGS) ×3 IMPLANT
STAPLE MEMOFIX MS 12X15X13 (Staple) ×3 IMPLANT
STAPLE MEMOFIX NIT 12X17X15 (Staple) ×3 IMPLANT
STOCKINETTE 4X48 STRL (DRAPES) ×3 IMPLANT
STRIP CLOSURE SKIN 1/2X4 (GAUZE/BANDAGES/DRESSINGS) ×2 IMPLANT
SUCTION FRAZIER TIP 10 FR DISP (SUCTIONS) ×3 IMPLANT
SUT ETHIBOND 3-0 V-5 (SUTURE) ×3 IMPLANT
SUT ETHILON 5 0 P 3 18 (SUTURE)
SUT FIBERWIRE 2-0 18 17.9 3/8 (SUTURE)
SUT FIBERWIRE 3-0 18 TAPR NDL (SUTURE)
SUT MERSILENE 4 0 P 3 (SUTURE) IMPLANT
SUT NYLON ETHILON 5-0 P-3 1X18 (SUTURE) IMPLANT
SUT PROLENE 3 0 PS 2 (SUTURE) IMPLANT
SUT STEEL 0 (SUTURE)
SUT STEEL 0 18XMFL TIE 17 (SUTURE) IMPLANT
SUT STEEL 3 0 (SUTURE) IMPLANT
SUT VIC AB 0 CT1 27 (SUTURE)
SUT VIC AB 0 CT1 27XBRD ANBCTR (SUTURE) IMPLANT
SUT VIC AB 2-0 PS2 27 (SUTURE) IMPLANT
SUT VIC AB 2-0 SH 27 (SUTURE)
SUT VIC AB 2-0 SH 27XBRD (SUTURE) IMPLANT
SUT VIC AB 3-0 FS2 27 (SUTURE) IMPLANT
SUT VIC AB 4-0 P-3 18XBRD (SUTURE) IMPLANT
SUT VIC AB 4-0 P3 18 (SUTURE)
SUT VICRYL 4-0 PS2 18IN ABS (SUTURE) IMPLANT
SUTURE FIBERWR 2-0 18 17.9 3/8 (SUTURE) IMPLANT
SUTURE FIBERWR 3-0 18 TAPR NDL (SUTURE) IMPLANT
SYR 3ML 23GX1 SAFETY (SYRINGE) IMPLANT
SYR BULB 3OZ (MISCELLANEOUS) IMPLANT
SYR CONTROL 10ML LL (SYRINGE) ×3 IMPLANT
TRAY DSU PREP LF (CUSTOM PROCEDURE TRAY) ×3 IMPLANT
TUBE CONNECTING 20'X1/4 (TUBING) ×1
TUBE CONNECTING 20X1/4 (TUBING) ×2 IMPLANT
UNDERPAD 30X30 INCONTINENT (UNDERPADS AND DIAPERS) ×3 IMPLANT
WATER STERILE IRR 1000ML POUR (IV SOLUTION) ×3 IMPLANT

## 2013-12-26 NOTE — Progress Notes (Signed)
Assisted Dr. Kasik with left, ultrasound guided, supraclavicular block. Side rails up, monitors on throughout procedure. See vital signs in flow sheet. Tolerated Procedure well. 

## 2013-12-26 NOTE — Transfer of Care (Signed)
Immediate Anesthesia Transfer of Care Note  Patient: Karl Cabrera  Procedure(s) Performed: Procedure(s): ARTHROSCOPY WRIST WITH FUSION RADIO LUNATE, FUSION SCAPHOLUNATE, RESECTION DISTAL POLE OF SCAPHOID (Left) ARTHRODESIS WRIST (Left)  Patient Location: PACU  Anesthesia Type:GA combined with regional for post-op pain  Level of Consciousness: awake, oriented and patient cooperative  Airway & Oxygen Therapy: Patient Spontanous Breathing and Patient connected to face mask oxygen  Post-op Assessment: Report given to PACU RN and Post -op Vital signs reviewed and stable  Post vital signs: Reviewed and stable  Complications: No apparent anesthesia complications

## 2013-12-26 NOTE — Anesthesia Postprocedure Evaluation (Signed)
  Anesthesia Post-op Note  Patient: Karl Cabrera  Procedure(s) Performed: Procedure(s): ARTHROSCOPY WRIST WITH FUSION RADIO LUNATE, FUSION SCAPHOLUNATE, RESECTION DISTAL POLE OF SCAPHOID (Left) ARTHRODESIS WRIST (Left)  Patient Location: PACU  Anesthesia Type:General  Level of Consciousness: awake and alert   Airway and Oxygen Therapy: Patient Spontanous Breathing  Post-op Pain: none  Post-op Assessment: Post-op Vital signs reviewed, Patient's Cardiovascular Status Stable, Respiratory Function Stable, Patent Airway, No signs of Nausea or vomiting and Pain level controlled  Post-op Vital Signs: Reviewed and stable  Complications: No apparent anesthesia complications

## 2013-12-26 NOTE — Op Note (Signed)
[id number]

## 2013-12-26 NOTE — Brief Op Note (Signed)
12/26/2013  12:22 PM  PATIENT:  Karl RiseAndrew Cabrera  23 y.o. male  PRE-OPERATIVE DIAGNOSIS:  LEFT WRIST DEGENERATIVE JOINT DISEASE STATUS POST FRACTURE MALUNION  POST-OPERATIVE DIAGNOSIS:  LEFT WRIST DEGENERATIVE JOINT DISEASE STATUS POST FRACTURE MALUNION  PROCEDURE:  Procedure(s): ARTHROSCOPY WRIST WITH FUSION RADIO LUNATE, FUSION SCAPHOLUNATE, RESECTION DISTAL POLE OF SCAPHOID (Left) ARTHRODESIS WRIST (Left) RESECTION POSTERIOR INTEROSSEUS NERVE  SURGEON:  Surgeon(s) and Role:    * Wyn Forsterobert V Ellory Khurana Jr., MD - Primary  PHYSICIAN ASSISTANT:   ASSISTANTS:Normon Pettijohn Dasnoit,P.A-C    ANESTHESIA:   general  EBL:  Total I/O In: 1000 [I.V.:1000] Out: -   BLOOD ADMINISTERED:none  DRAINS: none   LOCAL MEDICATIONS USED:  XYLOCAINE   SPECIMEN:  No Specimen  DISPOSITION OF SPECIMEN:  N/A  COUNTS:  YES  TOURNIQUET:  * Missing tourniquet times found for documented tourniquets in log:  [id number] *  DICTATION: .Other Dictation: Dictation Number [id number]  PLAN OF CARE: Discharge to home after PACU  PATIENT DISPOSITION:  PACU - hemodynamically stable.   Delay start of Pharmacological VTE agent (>24hrs) due to surgical blood loss or risk of bleeding: not applicable

## 2013-12-26 NOTE — Discharge Instructions (Addendum)
Hand Center Instructions °Hand Surgery ° °Wound Care: °Keep your hand elevated above the level of your heart.  Do not allow it to dangle by your side.  Keep the dressing dry and do not remove it unless your doctor advises you to do so.  He will usually change it at the time of your post-op visit.  Moving your fingers is advised to stimulate circulation but will depend on the site of your surgery.  If you have a splint applied, your doctor will advise you regarding movement. ° °Activity: °Do not drive or operate machinery today.  Rest today and then you may return to your normal activity and work as indicated by your physician. ° °Diet:  °Drink liquids today or eat a light diet.  You may resume a regular diet tomorrow.   ° °General expectations: °Pain for two to three days. °Fingers may become slightly swollen. ° °Call your doctor if any of the following occur: °Severe pain not relieved by pain medication. °Elevated temperature. °Dressing soaked with blood. °Inability to move fingers. °White or bluish color to fingers. ° ° °Post Anesthesia Home Care Instructions ° °Activity: °Get plenty of rest for the remainder of the day. A responsible adult should stay with you for 24 hours following the procedure.  °For the next 24 hours, DO NOT: °-Drive a car °-Operate machinery °-Drink alcoholic beverages °-Take any medication unless instructed by your physician °-Make any legal decisions or sign important papers. ° °Meals: °Start with liquid foods such as gelatin or soup. Progress to regular foods as tolerated. Avoid greasy, spicy, heavy foods. If nausea and/or vomiting occur, drink only clear liquids until the nausea and/or vomiting subsides. Call your physician if vomiting continues. ° °Special Instructions/Symptoms: °Your throat may feel dry or sore from the anesthesia or the breathing tube placed in your throat during surgery. If this causes discomfort, gargle with warm salt water. The discomfort should disappear within 24  hours. ° ° °Regional Anesthesia Blocks ° °1. Numbness or the inability to move the "blocked" extremity may last from 3-48 hours after placement. The length of time depends on the medication injected and your individual response to the medication. If the numbness is not going away after 48 hours, call your surgeon. ° °2. The extremity that is blocked will need to be protected until the numbness is gone and the  Strength has returned. Because you cannot feel it, you will need to take extra care to avoid injury. Because it may be weak, you may have difficulty moving it or using it. You may not know what position it is in without looking at it while the block is in effect. ° °3. For blocks in the legs and feet, returning to weight bearing and walking needs to be done carefully. You will need to wait until the numbness is entirely gone and the strength has returned. You should be able to move your leg and foot normally before you try and bear weight or walk. You will need someone to be with you when you first try to ensure you do not fall and possibly risk injury. ° °4. Bruising and tenderness at the needle site are common side effects and will resolve in a few days. ° °5. Persistent numbness or new problems with movement should be communicated to the surgeon or the [facility] ([phone])/ [facility] ([phone]). °

## 2013-12-26 NOTE — Anesthesia Procedure Notes (Addendum)
Anesthesia Regional Block:  Supraclavicular block  Pre-Anesthetic Checklist: ,, timeout performed, Correct Patient, Correct Site, Correct Laterality, Correct Procedure, Correct Position, site marked, Risks and benefits discussed,  Surgical consent,  Pre-op evaluation,  At surgeon's request and post-op pain management  Laterality: Left  Prep: chloraprep       Needles:  Injection technique: Single-shot  Needle Type: Echogenic Stimulator Needle     Needle Length: 5cm 5 cm Needle Gauge: 22 and 22 G    Additional Needles:  Procedures: ultrasound guided (picture in chart) and nerve stimulator Supraclavicular block  Nerve Stimulator or Paresthesia:  Response: 0.5 mA,   Additional Responses:   Narrative:  Start time: 12/26/2013 9:00 AM End time: 12/26/2013 9:11 AM Injection made incrementally with aspirations every 5 mL. Anesthesiologist: Dr Gypsy BalsamKasik  Additional Notes: [id number] L Supraclav Nerve Block POP CHG prep, sterile tech Good US visualization-PIX in chart, and nerve stim down to .5ma Multiple neg asp Vernia BuffMarc .5% w/epi 1:200000 total 25cc+decadron 4mg  infiltrated No compl Dr Gypsy BalsamKasik   Procedure Name: LMA Insertion Date/Time: 12/26/2013 10:35 AM Performed by: Caren MacadamARTER, Xiana Carns W Pre-anesthesia Checklist: Patient identified, Emergency Drugs available, Suction available and Patient being monitored Patient Re-evaluated:Patient Re-evaluated prior to inductionOxygen Delivery Method: Circle System Utilized Preoxygenation: Pre-oxygenation with 100% oxygen Intubation Type: IV induction Ventilation: Mask ventilation without difficulty LMA: LMA inserted LMA Size: 4.0 Number of attempts: 1 Airway Equipment and Method: bite block Placement Confirmation: positive ETCO2 and breath sounds checked- equal and bilateral Tube secured with: Tape Dental Injury: Teeth and Oropharynx as per pre-operative assessment

## 2013-12-26 NOTE — Anesthesia Preprocedure Evaluation (Signed)
Anesthesia Evaluation  Patient identified by MRN, date of birth, ID band Patient awake    Reviewed: Allergy & Precautions, H&P , NPO status , Patient's Chart, lab work & pertinent test results  Airway Mallampati: I TM Distance: >3 FB     Dental   Pulmonary COPDCurrent Smoker,  + rhonchi         Cardiovascular Rhythm:Regular Rate:Normal     Neuro/Psych Anxiety Depression Schizophrenia    GI/Hepatic GERD-  Medicated,  Endo/Other    Renal/GU      Musculoskeletal   Abdominal   Peds  Hematology   Anesthesia Other Findings   Reproductive/Obstetrics                           Anesthesia Physical Anesthesia Plan  ASA: III  Anesthesia Plan: General   Post-op Pain Management:    Induction: Intravenous  Airway Management Planned: LMA  Additional Equipment:   Intra-op Plan:   Post-operative Plan: Extubation in OR  Informed Consent: I have reviewed the patients History and Physical, chart, labs and discussed the procedure including the risks, benefits and alternatives for the proposed anesthesia with the patient or authorized representative who has indicated his/her understanding and acceptance.     Plan Discussed with: CRNA and Surgeon  Anesthesia Plan Comments:         Anesthesia Quick Evaluation

## 2013-12-27 ENCOUNTER — Encounter (HOSPITAL_BASED_OUTPATIENT_CLINIC_OR_DEPARTMENT_OTHER): Payer: Self-pay | Admitting: Orthopedic Surgery

## 2013-12-27 NOTE — Op Note (Signed)
NAME:  Karl Cabrera, Karl Cabrera NO.:  [id number]  MEDICAL RECORD NO.:  [id number]  LOCATION:                                 FACILITY:  PHYSICIAN:  Katy Fitch. Bulah Lurie, M.D. DATE OF BIRTH:  06/11/1991  DATE OF PROCEDURE:  12/26/2013 DATE OF DISCHARGE:                              OPERATIVE REPORT   PREOPERATIVE DIAGNOSIS:  Severe pain, left wrist status post intra- articular malunion of distal radius fracture sustained on March 21, 2013, while serving active duty in the [employer].  POSTOPERATIVE DIAGNOSIS:  Confirmation of severe lunate facet malunion with posttraumatic arthropathy and ulnocarpal translation due to malunion of radius with pain.  OPERATION: 1. Diagnostic arthroscopy, left wrist. 2. Resection of distal half of scaphoid. 3. Arthrodesis of left scaphoid and lunate to distal radius utilizing     autogenous bone graft, i.e., cancellous scaphoid graft and memory     staples x2. 4. Resection of 1-cm segment of posterior interosseous nerve in 4th     dorsal compartment.  OPERATING SURGEON:  Katy Fitch. Jenisis Harmsen, M.D.  ASSISTANT:  Marveen Reeks. Dasnoit, PA-C.  ANESTHESIA:  General by endotracheal technique supplemented by a left plexus block.  SUPERVISING ANESTHESIOLOGIST:  Bedelia Person, M.D.  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  He presented for evaluation of a very painful left wrist in the late summer of 2014.  While on active duty, he had sustained an intra-articular fracture of his left distal radius, which unfortunately led to a complex malunion predicament.  He presented due to severe wrist pain and inability to dorsiflex or palmar flex his wrist without significant discomfort. Plain films were carefully studied and compared to his right wrist documenting the presence of a significant radial malunion.  He was referred for advanced imaging studies, which confirmed  very significant displacement of his intra-articular fracture fragments with subsidence of the lunate facet and some ulnocarpal translation.  We had a detailed discussion with Karl Cabrera.  We advised that we might be able to perform a radiolunate fusion versus a radioscapholunate fusion to correct his pain versus an arthroscopic procedure to attempt the level of the joint with a neurectomy.  After careful consideration, he elected to proceed with the radioscapholunate fusion with partial scaphoid resection.  We advised that we would perform a diagnostic arthroscopy to confirm that his adjacent joint surfaces were acceptable prior to proceeding with this surgery.  After informed consent, he is brought to the operating room at this time.  Preoperatively, Karl Cabrera was advised of potential risks and benefits of surgery.  He was advised that he could experience a nonunion despite our best efforts with autogenous bone graft and internal fixation.  He understands his staples can become loose.  He understands that infection would necessitate further procedures including IV antibiotic therapy and possible drainage of his wrist.  Questions were invited and answered in detail.  Preoperatively, he was interviewed by Dr. Gypsy Balsam who provided detailed anesthesia informed consent.  Dr. Gypsy Balsam and I had discussed his predicament preoperatively and decided to advise Karl Cabrera to consider a perioperative plexus block.  He was advised of  the potential risks of plexus blocks including nerve injury at a rate of 2/10,000 cases as reflected in the literature.  Karl Cabrera has elected to proceed with the block.  Dr. Gypsy Balsam placed the block in the holding area with ultrasound guidance without complication recognized.  After questions were invited and answered and his left arm was marked as a proper surgical site per protocol, Karl Cabrera was brought to room 2 at this time.  PROCEDURE:  [name] was brought to room 2 of the [facility] and placed in a supine position upon the operating table.  A pneumatic tourniquet was applied to the proximal left brachium.  2 g of Ancef administered as an IV prophylactic antibiotic.  The entire left arm and hand were prepped with Betadine soap and solution, sterilely draped.  A pneumatic tourniquet was applied to the proximal brachium.  Following application of sterile stockinette and impervious arthroscopy drapes, the left arm was exsanguinated with Esmarch bandage and the arterial tourniquet inflated to 220 mmHg. Following routine surgical time-out, procedure commenced with distraction of the wrist with 10 pounds traction in a wrist arthroscopy tower with finger traps on the index and long finger and counter traction on the distal forearm.  The scope was placed through standard 3/4 dorsal viewing portal and the joint irrigated thoroughly.  There were abundant adhesions within the joint.  The fracture site was easily identified at the lunate facet and the radial aspect of the scaphoid facet.  There was a very severely displaced fracture with multiple intra-articular fragments that were displaced at least 4, 5, and even 6 mm.  There was considerable impaction of the lunate facet. There were multiple adhesions limiting motion between the scaphoid and the radius and the lunate and the radius.  We had, had a detailed discussion with Mr. [name] preoperatively.  He elected to go for an attempted arthrodesis for pain control as pain was a very significant issue for him.  We therefore obtained documentary images and confirmed that the adjacent joint surfaces were acceptable. We then removed the arthroscopic equipment, removed the tower, and proceeded to perform a dorsal radial wrist arthrotomy in the line of the radial aspect of the fourth dorsal compartment.  The retinaculum was taken sharply and the sensory nerves protected.   The tendons of the fourth dorsal compartment retracted ulnarly, the tendons of the second dorsal compartment radially, the third dorsal compartment was unroofed, and the extensor pollicis longus retracted.  We performed a capsulotomy exposing the articular surface once again confirmed the severe damage to the lunate facet and entered the scaphoid facet of the distal radius.  We then proceeded to perform a resection of the distal half of the scaphoid outlining with a 2 mm osteotome and use of a fluoroscope.  We carefully removed the entire distal half of the scaphoid leveling it so that we would have optimum contact with the scaphoid facet of the radius.  We then morcellized the removed scaphoid and discarded the hyaline cartilage and kept all the cancellous bone as an autogenous bone graft.  We then performed removal of hyaline articular cartilage on the remaining adjacent surface of the scaphoid, lunate, and radius followed by filling the void with cancellous autograft.  Two shaped memory staples were placed, one in the center of the scaphoid, one in the center of the lunate with careful measurement with a depth gauge to be sure that we deployed properly sized staples.  The staples were allowed to warm  and compress leading to a very satisfactory construct.  Multiple images were obtained documenting very satisfactory position of staples within the scaphoid, lunate, and radius.  The joint was thoroughly lavaged and loose remaining cancellous fragments removed from the arthrodesis site.   After documentary x-rays were obtained, the capsule was anatomically repaired.  The floor of the fourth dorsal compartment was explored and a 1 cm segment of the posterior interosseous nerve was resected for postoperative comfort.  The capsule was repaired anatomically to a cuff of tissue on the dorsum of the radius with multiple figure-of-eight sutures of 3-0 Ethibond knots buried, and  the retinaculum was repaired with 2-0 Ethibond knots buried.  The skin was repaired with subcutaneous 4-0 Vicryl and intradermal 3-0 Prolene with Steri-Strips.  Mr. Karen Kitchensrujillo was placed in a compressive dressing with sterile gauze, sterile Kerlix, sterile Webril, and plaster splints maintaining the wrist in a slightly dorsiflexed position.  There were no apparent complications.  For aftercare, he is provided prescriptions for Dilaudid 2 mg, 1-2 tablets p.o. q.4-6 hours p.r.n. pain, 30 tablets without refill, also Keflex 500 mg 1 p.o. q.8 hours x4 days as a prophylactic antibiotic.  We will encourage him to use naproxen sodium as an additional analgesic.     Katy Fitchobert V. Emberlynn Riggan, M.D.     RVS/MEDQ  D:  12/26/2013  T:  12/27/2013  Job:  [id number]

## 2014-01-04 ENCOUNTER — Encounter (INDEPENDENT_AMBULATORY_CARE_PROVIDER_SITE_OTHER): Payer: Self-pay

## 2014-01-04 ENCOUNTER — Ambulatory Visit (INDEPENDENT_AMBULATORY_CARE_PROVIDER_SITE_OTHER): Admitting: Psychiatry

## 2014-01-04 VITALS — BP 127/75 | HR 82 | Ht 67.5 in | Wt 157.5 lb

## 2014-01-04 DIAGNOSIS — F332 Major depressive disorder, recurrent severe without psychotic features: Secondary | ICD-10-CM

## 2014-01-04 DIAGNOSIS — F429 Obsessive-compulsive disorder, unspecified: Secondary | ICD-10-CM

## 2014-01-04 DIAGNOSIS — F331 Major depressive disorder, recurrent, moderate: Secondary | ICD-10-CM

## 2014-01-04 MED ORDER — FLUOXETINE HCL 10 MG PO CAPS: ORAL_CAPSULE | ORAL | Status: AC

## 2014-01-04 MED ORDER — RISPERIDONE 1 MG PO TBDP: 1.0000 mg | ORAL_TABLET | Freq: Every day | ORAL | Status: AC
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
## 2014-01-05 ENCOUNTER — Encounter (HOSPITAL_COMMUNITY): Payer: Self-pay | Admitting: Psychology

## 2014-01-05 ENCOUNTER — Ambulatory Visit (INDEPENDENT_AMBULATORY_CARE_PROVIDER_SITE_OTHER): Admitting: Psychology

## 2014-01-05 DIAGNOSIS — F331 Major depressive disorder, recurrent, moderate: Secondary | ICD-10-CM

## 2014-01-05 DIAGNOSIS — F429 Obsessive-compulsive disorder, unspecified: Secondary | ICD-10-CM
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
## 2014-01-18 ENCOUNTER — Ambulatory Visit (INDEPENDENT_AMBULATORY_CARE_PROVIDER_SITE_OTHER): Admitting: Psychology

## 2014-01-18 DIAGNOSIS — F429 Obsessive-compulsive disorder, unspecified: Secondary | ICD-10-CM

## 2014-01-18 DIAGNOSIS — F333 Major depressive disorder, recurrent, severe with psychotic symptoms: Secondary | ICD-10-CM

## 2014-01-18 NOTE — Progress Notes (Signed)
   THERAPIST PROGRESS NOTE  Session Time: 12.35pm-1.25pm  Participation Level: Active  Behavioral Response: Well GroomedAlert, AFFECT WNL  Type of Therapy: Individual Therapy  Treatment Goals addressed: Diagnosis: OCD, MDD and goal 1 and 2.  Interventions: CBT and Other: Tx Planning  Summary: Karl Cabrera Chronis is a 23 y.o. male who presents with report of anxiety, improved mood w/ depression.  Pt discussed living situation, upcoming changes w/ brother moving out and decisions on whether to stay in the area for school or go to [place] where friend has offered space in his home.  Pt reports he took SAT and awaiting these scores to explore options w/ college. Pt reported on positives for self and goals for counseling.  Pt discusses unhealthy thought patterns and how he has worked on these in counseling.  Pt has interest in heart math.  Suicidal/Homicidal: Nowithout intent/plan  Therapist Response: Assessed pt current functioning per pt report.  Explored w/pt his decision about living situation- options and benefits and supports.  Developed tx plan w/ pt discussing goals, interventions and pt strengths.   Plan: Return again in 2 weeks.  Diagnosis: Axis I: Obsessive Compulsive Disorder and MDD    Axis II: No diagnosis    YATES,LEANNE, LPC 01/18/2014

## 2014-01-24 ENCOUNTER — Ambulatory Visit (HOSPITAL_COMMUNITY): Payer: Self-pay | Admitting: Psychology

## 2014-02-01 ENCOUNTER — Ambulatory Visit (HOSPITAL_COMMUNITY): Payer: Self-pay | Admitting: Psychology

## 2014-02-08 ENCOUNTER — Encounter (HOSPITAL_COMMUNITY): Payer: Self-pay

## 2014-02-08 ENCOUNTER — Ambulatory Visit (HOSPITAL_COMMUNITY): Payer: Self-pay | Admitting: Psychology

## 2014-03-06 ENCOUNTER — Ambulatory Visit (HOSPITAL_COMMUNITY): Payer: Self-pay | Admitting: Psychiatry

## 2014-03-14 ENCOUNTER — Other Ambulatory Visit: Payer: Self-pay | Admitting: Orthopedic Surgery

## 2014-03-14 DIAGNOSIS — M12539 Traumatic arthropathy, unspecified wrist: Secondary | ICD-10-CM

## 2014-03-22 ENCOUNTER — Ambulatory Visit
Admission: RE | Admit: 2014-03-22 | Discharge: 2014-03-22 | Disposition: A | Discharge status: Home / Self Care | Source: Ambulatory Visit | Attending: Orthopedic Surgery | Admitting: Orthopedic Surgery

## 2014-03-22 DIAGNOSIS — M12539 Traumatic arthropathy, unspecified wrist: Secondary | ICD-10-CM

## 2014-10-03 ENCOUNTER — Encounter (HOSPITAL_COMMUNITY): Payer: Self-pay | Admitting: Psychology

## 2014-10-03 DIAGNOSIS — F429 Obsessive-compulsive disorder, unspecified: Secondary | ICD-10-CM

## 2014-10-03 DIAGNOSIS — F331 Major depressive disorder, recurrent, moderate: Secondary | ICD-10-CM

## 2014-10-03 NOTE — Progress Notes (Signed)
Karl Cabrera is a 23 y.o. male patient who last came to counseling on 01/18/14.  Outpatient Therapist Discharge Summary  Karl Cabrera    01/12/1991   Admission Date: 01/05/14   Discharge Date:  10/03/14 Reason for Discharge:  No shows and not active Diagnosis:   Major depressive disorder, recurrent episode, moderate  Obsessive compulsive disorder    Comments:  Pt last attended on 01/18/14 then no showed for 3 f/u appointments.   Malena PeerLeanne Aldea Avis           Shiquita Collignon, LPC

## 2014-11-30 IMAGING — CT CT WRIST*L* W/O CM
3 of 6 series · 16 of 36 positions shown, 18 images · non-contrast
Comparison: MRI dated 11/06/2013

CLINICAL DATA: Traumatic arthropathy of the wrist. Previous left
wrist fractures. Surgical repair in December 2013. Painful range of
motion. Radioscapholunate fusion with resection of the distal
scaphoid.

EXAM:
CT OF THE LEFT WRIST WITHOUT CONTRAST
TECHNIQUE: Multidetector CT imaging was performed according to the standard
protocol. Multiplanar CT image reconstructions were also generated.

[Series 100: thin standard · axial · 0.29mm/px · z∈[-27,+98]mm · 8 of 520 slices shown, 10 images]
[im 52/520  soft-tissue]
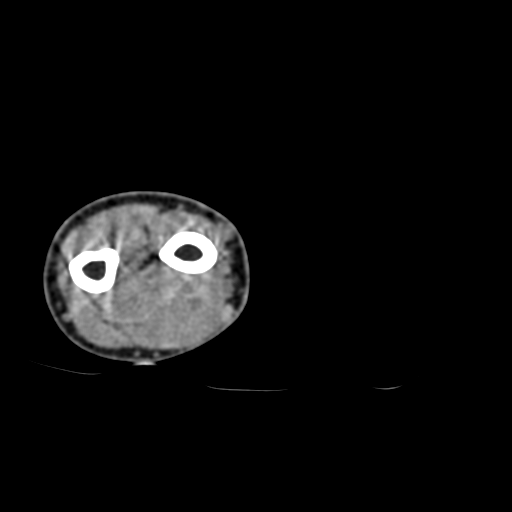
[im 52/520  bone]
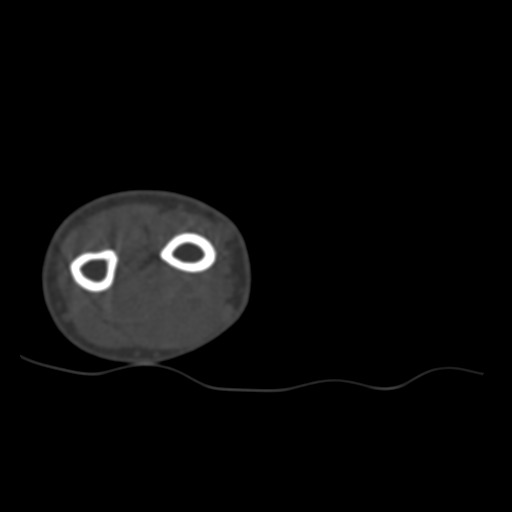
[im 104/520  bone]
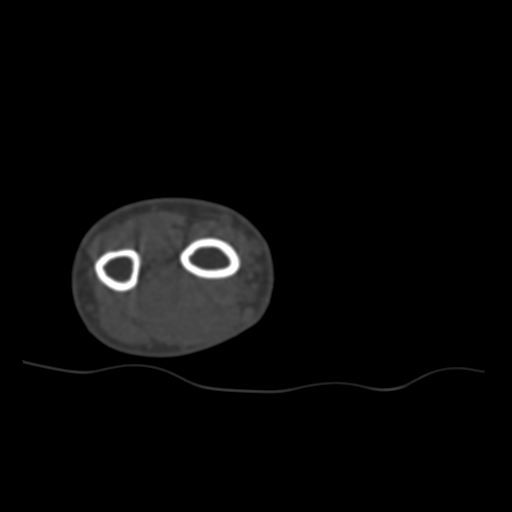
[im 156/520  bone]
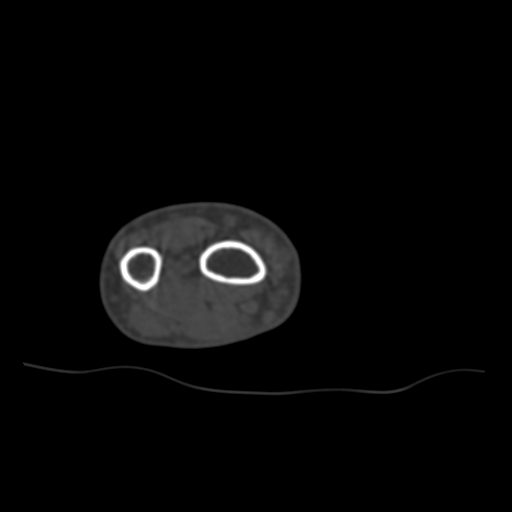
[im 208/520  bone]
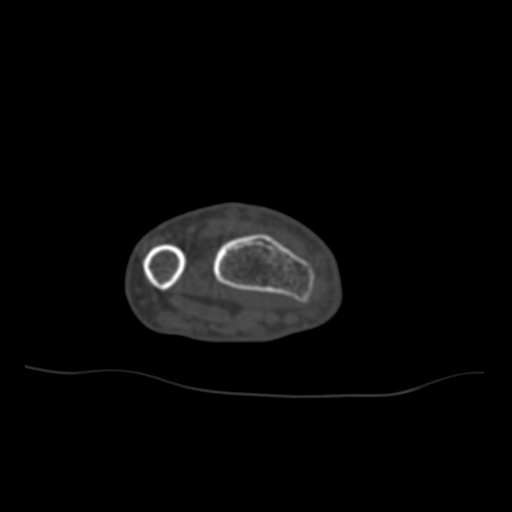
[im 312/520  soft-tissue]
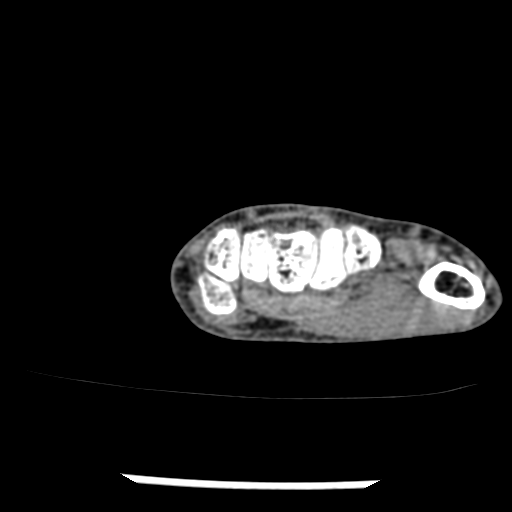
[im 312/520  bone]
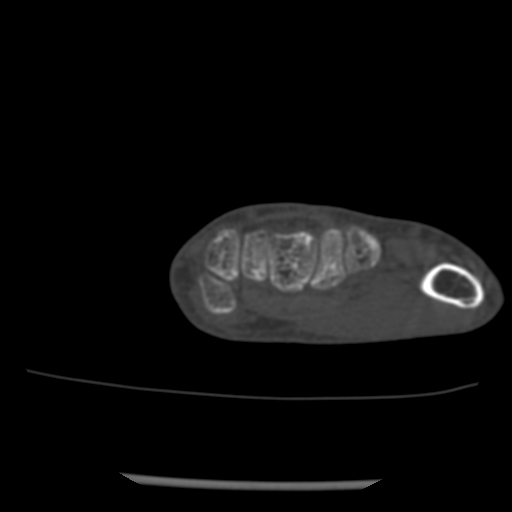
[im 364/520  bone]
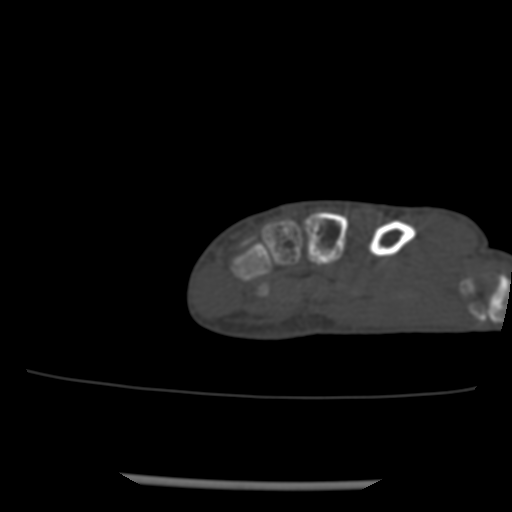
[im 416/520  bone]
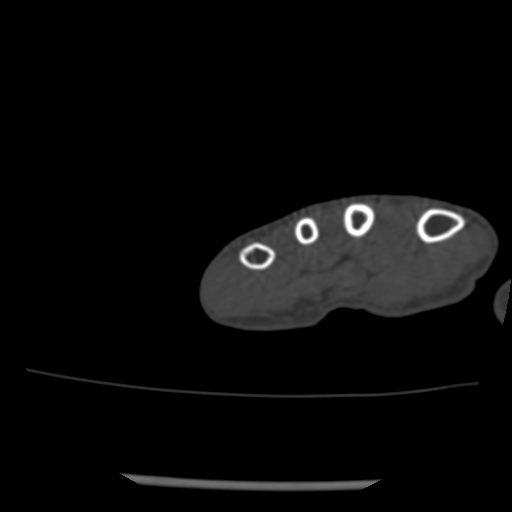
[im 468/520  bone]
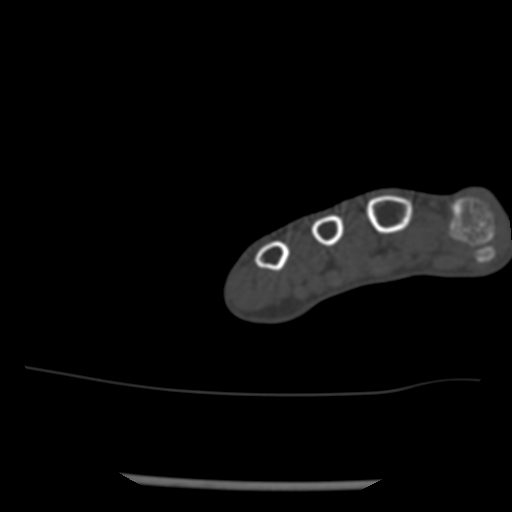

[Series 101: cor standard · coronal · 0.31mm/px · 3 of 28 slices shown]
[im 6/28  bone]
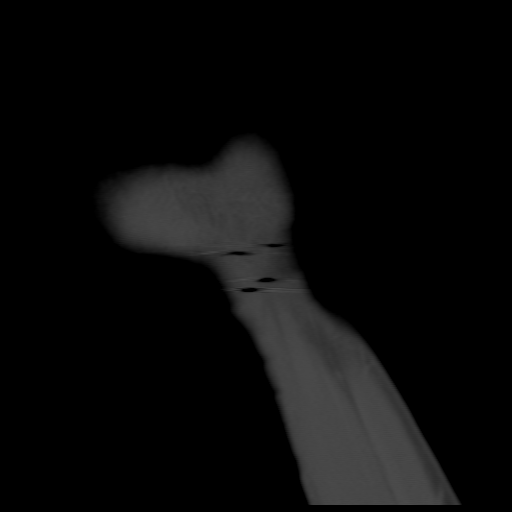
[im 11/28  bone]
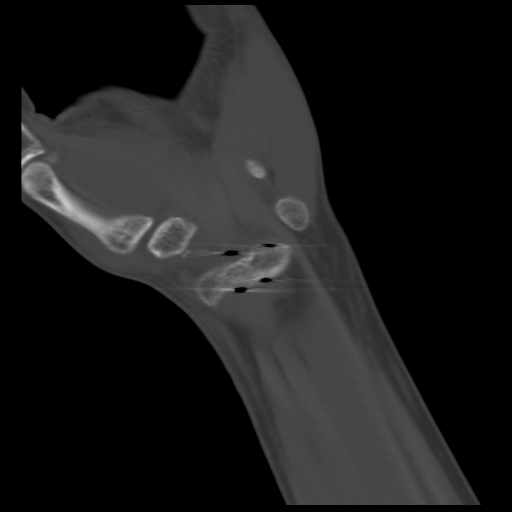
[im 17/28  bone]
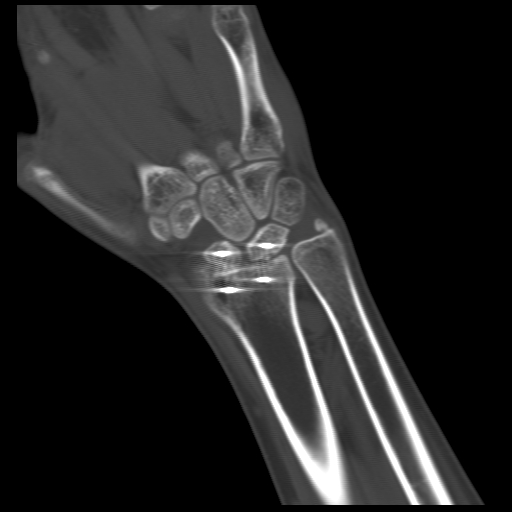

[Series 400: sag bone · sagittal · 0.31mm/px · 5 of 38 slices shown]
[im 8/38  bone]
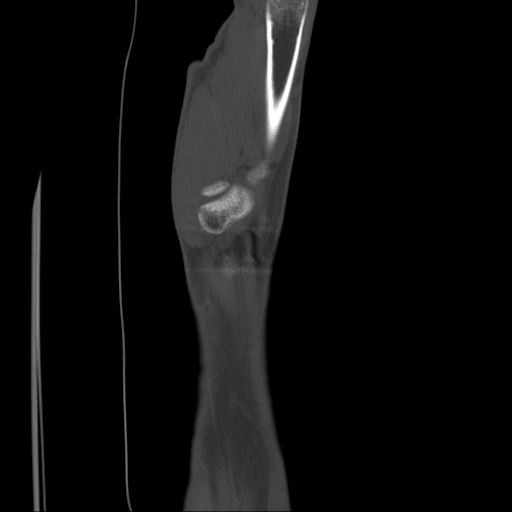
[im 10/38  soft-tissue]
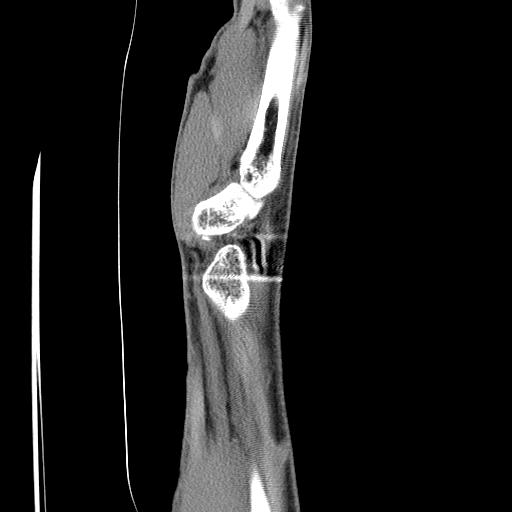
[im 15/38  bone]
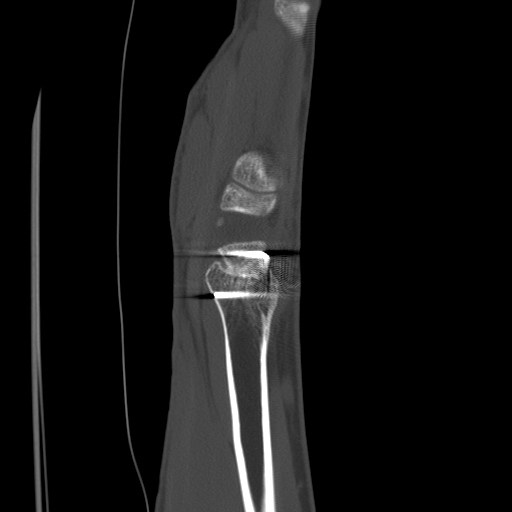
[im 23/38  bone]
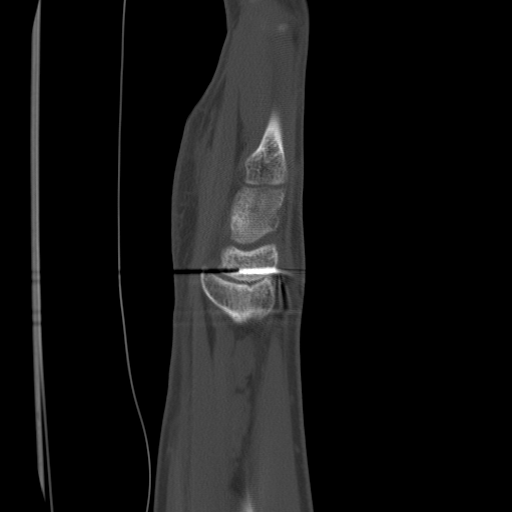
[im 30/38  bone]
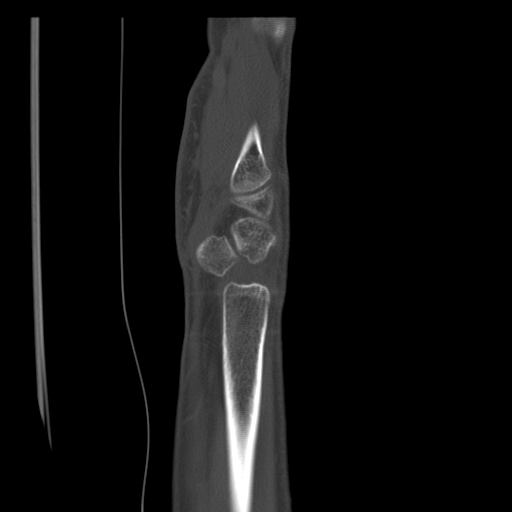

[16 of 36 positions shown; findings below may reference images not displayed]

FINDINGS: The scan demonstrates solid osseous fusion between the proximal pole
of the scaphoid and the distal radius. Distal scaphoid has been
resected. There is no visible osseous fusion between the lunate and
scaphoid. There is no definitive osseous fusion between the lunate
and the distal radius. There is a small calcification seen in the
space between the radius and lunate best seen on image 22 of series
400.

The memory staples appear in excellent position and there is no
evidence of loosening. The staples do not protrude through the
bones.

Note is made of an old avulsion of the ulnar styloid. There are tiny
calcifications adjacent to the proximal aspects of the trapezium and
trapezoid which are not felt to be significant. There are tiny
calcifications adjacent to the dorsal aspect of the lunate.

There is an effusion at the midcarpal joint.

No other abnormalities.
IMPRESSION: 1. Solid osseous fusion between proximal pole of the scaphoid and
the radius.
2. No definitive solid osseous fusion between the lunate and the
distal radius.
3. Staples are in good position with no evidence of loosening.

## 2017-12-29 ENCOUNTER — Ambulatory Visit
Admission: RE | Admit: 2017-12-29 | Discharge: 2017-12-29 | Disposition: A | Discharge status: Home / Self Care | Payer: Disability Insurance | Source: Ambulatory Visit | Attending: EXTERNAL | Admitting: EXTERNAL

## 2017-12-29 ENCOUNTER — Other Ambulatory Visit (HOSPITAL_COMMUNITY): Payer: Self-pay | Admitting: EXTERNAL

## 2017-12-29 DIAGNOSIS — M4310 Spondylolisthesis, site unspecified: Secondary | ICD-10-CM | POA: Insufficient documentation

## 2017-12-29 DIAGNOSIS — M47816 Spondylosis without myelopathy or radiculopathy, lumbar region: Secondary | ICD-10-CM | POA: Insufficient documentation

## 2017-12-29 DIAGNOSIS — G8929 Other chronic pain: Secondary | ICD-10-CM

## 2017-12-29 DIAGNOSIS — M549 Dorsalgia, unspecified: Principal | ICD-10-CM

## 2019-01-16 ENCOUNTER — Ambulatory Visit: Payer: 59 | Attending: Physician Assistant | Admitting: Physician Assistant

## 2019-01-16 ENCOUNTER — Encounter (HOSPITAL_BASED_OUTPATIENT_CLINIC_OR_DEPARTMENT_OTHER): Payer: Self-pay | Admitting: Physician Assistant

## 2019-01-16 DIAGNOSIS — R200 Anesthesia of skin: Secondary | ICD-10-CM

## 2019-01-16 DIAGNOSIS — Z981 Arthrodesis status: Secondary | ICD-10-CM

## 2019-01-16 DIAGNOSIS — R202 Paresthesia of skin: Secondary | ICD-10-CM

## 2019-01-16 DIAGNOSIS — S6990XA Unspecified injury of unspecified wrist, hand and finger(s), initial encounter: Secondary | ICD-10-CM

## 2019-01-16 NOTE — Progress Notes (Signed)
[facility]  [address]  [place] [address]  [phone]      Date: 01/16/2019  Name: Joseph Russo  Age: 28 y.o.  DOB:  October 10, 1991    Chief Complaint: Hand/Wrist Pain (left)      History of Present Illness:   Joseph Russo is a 28 y.o. male right hand dominant, college student, studying Psychology, who presents today for evaluation of left wrist and hand pain. He states he fell 10 feet approximately 6 years ago. He states he did complete a course of therapy. He states approximately 5 years ago he had surgery in [place] where he had states he had some bones removed and other bones stapled together. He continues to have difficulty with pain and decreased motion. He rates his pain 7/10 today. It is intermittent. It is worse with lifting and improved with rest. He reports significant decreased grip strength. He reports numbness and tingling in all the fingers. He is here for further evaluation today.       Past Medical History:     History reviewed. No pertinent past medical history.              Family Medical History:     None            Social History     Tobacco Use   . Smoking status: Not on file   Substance Use Topics   . Alcohol use: Not on file     No current outpatient medications on file.     No Known Allergies    Review of Systems:     As per HPI otherwise Review of Systems is Negative.    Physical Examination:     There were no vitals taken for this visit.        GENERAL: Patient is in no acute distress. Awake, alert and oriented x 3. Mood is appropriate.  HEENT: Head is normocephalic, atraumatic. Extraocular movements are intact  NECK: Demonstrates functional range of motion. Trachea is midline.  CHEST: Demonstrates full expansion with inspiration.  HEART: Regular rate and rhythm.  ABDOMEN: Soft, Non-distended.  MUSCULOSKELETAL:left upper extremity: Skin is intact without lesion. No signs of infection. No erythema, drainage, or increased warmth.  No palpable masses. No lymphadenopathy. Tenderness noted throughout the wrist. He is able to flex and extend the digits. He is able to make a fist. He is able to oppose the thumb with all digits. He has functional range of motion of the wrist. Motion is decreased secondary to pain and history of fusion. Negative Tinel's at the wrist and elbow. Positive carpal compression and phalen's testing. Radial ulnar and median nerve function is intact. Radial pulses 2+. Sensation is intact throughout the hand. Capillary refill is less than 2 seconds.       Procedure:   None    Data Reviewed:   I personally reviewed 3 views of left wrist obtained 09/15/18, there is postoperative changes noted with excision of the distal scaphoid and fusion noted of the radius scaphoid and lunate.  Surgical hardware was in place with no signs of loosening.    Assessment:       ICD-10-CM    1. Wrist injury S69.90XA NCS/EMG   2. Status post fusion of wrist Z98.1 NCS/EMG    partial fusion, 5 years ago    3. Numbness and tingling in left hand R20.0 NCS/EMG    R20.2        Plan:  I discussed my evaluation and treatment plan with [name]. We discussed his x-rays and the natural course of partial wrist fusions. We discussed a complete fusion is used for pain relief.  We discussed concerns for complete fusion of the wrist due to his young age.  We would like to wait on that surgery until he is older.  We did discuss further evaluation of the carpal tunnel nerve. We discussed we may consider treatment options for possible carpal tunnel to see if this may improve some of his symptoms.  We discussed the need for an EMG study for further evaluation. We will continue to treat him conservatively. He will continue to utilize activity modification, anti-inflammatories, and rest.  We will see how he progresses with this treatment plan. We will see him back after we obtain his EMG study.  They are to call or return sooner if they develop any new or  concerning symptoms. All questions were answered to their satisfaction. They do understand and agree with the treatment plan. Patient will follow up as directed.       I am scribing for, and in the presence of, Kallie Edward, PA-C for services provided on 01/16/2019.  [address], SCRIBE      [place], [place] 01/16/2019, 10:40        I personally performed the services described in this documentation, as scribed  in my presence, and it is both accurate  and complete.    Kallie Edward, PA-C    I have reviewed the note and made changes where needed.  I agree with the plan as above.    Thelma Barge, MD

## 2021-05-21 HISTORY — PX: HX COLONOSCOPY: 2100001147

## 2021-07-30 ENCOUNTER — Ambulatory Visit (INDEPENDENT_AMBULATORY_CARE_PROVIDER_SITE_OTHER): Payer: 59 | Admitting: Urology

## 2021-07-30 ENCOUNTER — Other Ambulatory Visit: Payer: Self-pay

## 2021-07-30 ENCOUNTER — Encounter (INDEPENDENT_AMBULATORY_CARE_PROVIDER_SITE_OTHER): Payer: Self-pay | Admitting: Urology

## 2021-07-30 VITALS — BP 126/52 | HR 85 | Temp 97.6°F | Ht 67.0 in | Wt 119.8 lb

## 2021-07-30 DIAGNOSIS — Z3009 Encounter for other general counseling and advice on contraception: Secondary | ICD-10-CM

## 2021-07-30 DIAGNOSIS — F640 Transsexualism: Secondary | ICD-10-CM

## 2021-07-30 NOTE — Progress Notes (Signed)
Chief Complaint   Patient presents with   . Vasectomy Consult       HISTORY: The patient is 30 y.o. gentleman who presents today requesting an elective vasectomy.  He has no children and desires to have none. He is currently transitioning to male gender. He is taking estrogen supplementation.  The patient denies any genitourinary history.  He does not use aspirin or NSAID's.  He denies a history of bleeding abnormalities and/or transfusions. He understands that an elective vasectomy is considered a permanent procedure.      ATTENDING CONSENT NOTE    Elective vasectomy procedure explained in detail along with possible risks and complications to include but not limited to bleeding, infection, swelling, bruising, acute and chronic pain, anesthetic reaction, the remote possibility of spontaneous reversal (failed vasectomy) quoted at approximately 0.5%.  The risk of post-vasectomy pain syndrome, bleeding or infection is 1-2%. Elective vasectomy has no association with heart disease, prostate cancer or other diseases or illnesses in large volume studies. The absolute necessity for continued contraception following vasectomy was explained as well as the need for post-vasectomy simple semen analysis at 8 weeks with no sperm in the sample prior to unprotected intercourse was explained. Patient verbalized understanding and agreed to the procedure. All questions were answered and he accepts all possible risks involved and wishes to proceed with elective vasectomy.    Patient counseled and case discussed regarding informed consent for elective vasectomy.    PMHx:   Transitioning to male    PSHx:  Hernia repair, age 63    MEDS: Spironolactone 200 mg daily    Estradiol 1 mg weekly    No Known Allergies      FHx: HTN- GP  DM- GP  Cancer- GP      SHx:   Unemployed, smokes 1/4 ppd x 15 years, rare ETOH, smokes marijuana        REVIEW OF SYSTEMS    Negative for constitutional symptoms, ENT, cardiovascular, respiratory,  gastrointestinal, genitourinary; musculoskeletal, skin and/or breasts, neurological, psychiatric, endocrine, hematologic, lymphatic, and allergic immunologic.    PHYSICAL EXAMINATION:     VS:  BP (!) 126/52   Pulse 85   Temp 36.4 C (97.6 F) (Thermal Scan)   Ht 1.702 m (5\' 7" )   Wt 54.3 kg (119 lb 12.8 oz)   BMI 18.76 kg/m       Thin young man in no apparent distress.  Neck is supple  Respiratory examination reveals good inspiratory effort bilaterally  Cardiac examination reveals a regular rate and rhythm  Abdomen is free of masses, tenderness or hepatosplenomegaly.  Penis is normal.  Testes are bilaterally descended and unremarkable.  Vasa are clearly palpable bilaterally.    Skin is normal  Musculoskeletal and neurological examinations are within normal limits      Urine Dip Results:      Urine Dip Results:           IMPRESSION:  Desires sterility      DISPOSITION:  The procedure will be scheduled in Outpatient Surgery on [address] West.  Date and time to follow. Patient will sign his consent form on the morning of the procedure. No NSAID's for 7-10 days prior to procedure. Bring driver on day of procedure. NPO after midnight prior to procedure.  Vasectomy instruction and fact sheet given to patient today.  All of the patient's questions were answered prior to leaving the office today.    On the day of the encounter, a total of 20  minutes was spent on this patient encounter including review of historical information, examination, documentation and post-visit activities.     Avie Arenas, MD  07/30/2021, 13:59  Avie Arenas, Montez Hageman., MD, FACS  Associate Professor of Urology  [facility] Department of Urology

## 2021-08-19 HISTORY — PX: UMBILICAL HERNIA REPAIR: SHX196

## 2021-09-05 ENCOUNTER — Encounter (HOSPITAL_COMMUNITY): Payer: Self-pay

## 2021-09-05 ENCOUNTER — Inpatient Hospital Stay (HOSPITAL_COMMUNITY)
Admission: RE | Admit: 2021-09-05 | Discharge: 2021-09-05 | Disposition: A | Discharge status: Home / Self Care | Payer: 59 | Source: Ambulatory Visit

## 2021-09-05 ENCOUNTER — Other Ambulatory Visit: Payer: Self-pay

## 2021-09-05 HISTORY — DX: Other specified postprocedural states: Z98.890

## 2021-09-05 HISTORY — DX: Other specified disorders of pigmentation: L81.8

## 2021-09-05 HISTORY — DX: Other chronic pain: G89.29

## 2021-09-05 HISTORY — DX: Personal history of Methicillin resistant Staphylococcus aureus infection: Z86.14

## 2021-09-05 HISTORY — DX: Acute upper respiratory infection, unspecified: J06.9

## 2021-09-05 NOTE — Nurses Notes (Signed)
Patient scheduled for vasectomy 09/09/2021 and stated that he was not diagnosed with URI, but his child was diagnosed and he currently has a productive cough. Dr. Dianna Limbo, Anesthesia consult notified and stated for patient to see PCP prior to procedure. Dr. Venia Minks and service notified by inbasket message. Patient notified of needing to see PCP prior to procedure and verbalized understanding.

## 2021-10-14 ENCOUNTER — Encounter (HOSPITAL_COMMUNITY): Payer: Self-pay

## 2021-10-14 ENCOUNTER — Inpatient Hospital Stay (HOSPITAL_COMMUNITY)
Admission: RE | Admit: 2021-10-14 | Discharge: 2021-10-14 | Disposition: A | Discharge status: Home / Self Care | Payer: 59 | Source: Ambulatory Visit

## 2021-10-14 ENCOUNTER — Other Ambulatory Visit: Payer: Self-pay

## 2021-10-20 ENCOUNTER — Ambulatory Visit (INDEPENDENT_AMBULATORY_CARE_PROVIDER_SITE_OTHER): Payer: Self-pay | Admitting: Urology

## 2021-10-20 ENCOUNTER — Ambulatory Visit (HOSPITAL_COMMUNITY): Payer: 59 | Admitting: Urology

## 2021-10-20 ENCOUNTER — Ambulatory Visit (HOSPITAL_BASED_OUTPATIENT_CLINIC_OR_DEPARTMENT_OTHER): Payer: 59 | Admitting: Anesthesiology

## 2021-10-20 ENCOUNTER — Ambulatory Visit (HOSPITAL_COMMUNITY): Payer: 59 | Admitting: Anesthesiology

## 2021-10-20 ENCOUNTER — Other Ambulatory Visit: Payer: Self-pay

## 2021-10-20 ENCOUNTER — Encounter (HOSPITAL_COMMUNITY): Payer: Self-pay | Admitting: Urology

## 2021-10-20 ENCOUNTER — Encounter (HOSPITAL_COMMUNITY)
Admission: RE | Disposition: A | Discharge status: Home / Self Care | Payer: Self-pay | Source: Ambulatory Visit | Attending: Urology

## 2021-10-20 ENCOUNTER — Ambulatory Visit
Admission: RE | Admit: 2021-10-20 | Discharge: 2021-10-20 | Disposition: A | Discharge status: Home / Self Care | Payer: 59 | Source: Ambulatory Visit | Attending: Urology | Admitting: Urology

## 2021-10-20 DIAGNOSIS — Z9852 Vasectomy status: Secondary | ICD-10-CM

## 2021-10-20 DIAGNOSIS — Z302 Encounter for sterilization: Secondary | ICD-10-CM | POA: Insufficient documentation

## 2021-10-20 DIAGNOSIS — Z7289 Other problems related to lifestyle: Secondary | ICD-10-CM

## 2021-10-20 SURGERY — VASECTOMY
Anesthesia: Monitor Anesthesia Care | Laterality: Bilateral | Wound class: Clean Contaminated Wounds-The respiratory, GI, Genital, or urinary

## 2021-10-20 MED ORDER — LACTATED RINGERS INTRAVENOUS SOLUTION
INTRAVENOUS | Status: DC
Start: 2021-10-20 — End: 2021-10-20
  Administered 2021-10-20: 09:00:00 0 via INTRAVENOUS

## 2021-10-20 MED ORDER — BUPIVACAINE (PF) 0.5 % (5 MG/ML) INJECTION SOLUTION
20.0000 mL | Freq: Once | INTRAMUSCULAR | Status: DC
Start: 2021-10-20 — End: 2021-10-20

## 2021-10-20 MED ORDER — FENTANYL (PF) 50 MCG/ML INJECTION SOLUTION
Freq: Once | INTRAMUSCULAR | Status: DC | PRN
Start: 2021-10-20 — End: 2021-10-20
  Administered 2021-10-20 (×2): 50 ug via INTRAVENOUS

## 2021-10-20 MED ORDER — CEFAZOLIN 1 GRAM SOLUTION FOR INJECTION
INTRAMUSCULAR | Status: AC
Start: 2021-10-20 — End: 2021-10-20
  Filled 2021-10-20: qty 10

## 2021-10-20 MED ORDER — FENTANYL (PF) 50 MCG/ML INJECTION SOLUTION
INTRAMUSCULAR | Status: AC
Start: 2021-10-20 — End: 2021-10-20
  Filled 2021-10-20: qty 2

## 2021-10-20 MED ORDER — SODIUM CHLORIDE 0.9 % (FLUSH) INJECTION SYRINGE
2.0000 mL | INJECTION | Freq: Three times a day (TID) | INTRAMUSCULAR | Status: DC
Start: 2021-10-20 — End: 2021-10-20

## 2021-10-20 MED ORDER — LIDOCAINE HCL 20 MG/ML (2 %) INJECTION SOLUTION
15.0000 mL | Freq: Once | INTRAMUSCULAR | Status: DC | PRN
Start: 2021-10-20 — End: 2021-10-20
  Administered 2021-10-20: 09:00:00 10 mL via INTRAMUSCULAR

## 2021-10-20 MED ORDER — SODIUM CHLORIDE 0.9 % (FLUSH) INJECTION SYRINGE
2.0000 mL | INJECTION | INTRAMUSCULAR | Status: DC | PRN
Start: 2021-10-20 — End: 2021-10-20

## 2021-10-20 MED ORDER — CEPHALEXIN 500 MG CAPSULE
500.0000 mg | ORAL_CAPSULE | Freq: Three times a day (TID) | ORAL | 0 refills | Status: AC
Start: 2021-10-20 — End: 2021-10-25
  Filled 2021-10-20: qty 15, 5d supply, fill #0

## 2021-10-20 MED ORDER — HYDROCODONE 5 MG-ACETAMINOPHEN 325 MG TABLET
1.0000 | ORAL_TABLET | ORAL | 0 refills | Status: AC | PRN
Start: 2021-10-20 — End: ?
  Filled 2021-10-20: qty 2, 1d supply, fill #0

## 2021-10-20 MED ORDER — BACITRACIN 500 UNIT/G OINTMENT TUBE
TOPICAL_OINTMENT | Freq: Once | CUTANEOUS | Status: DC | PRN
Start: 2021-10-20 — End: 2021-10-20

## 2021-10-20 MED ORDER — PROPOFOL 10 MG/ML IV BOLUS
INJECTION | Freq: Once | INTRAVENOUS | Status: DC | PRN
Start: 2021-10-20 — End: 2021-10-20
  Administered 2021-10-20 (×2): 50 mg via INTRAVENOUS

## 2021-10-20 MED ORDER — ONDANSETRON HCL (PF) 4 MG/2 ML INJECTION SOLUTION
Freq: Once | INTRAMUSCULAR | Status: DC | PRN
Start: 2021-10-20 — End: 2021-10-20
  Administered 2021-10-20: 4 mg via INTRAVENOUS

## 2021-10-20 MED ORDER — MIDAZOLAM (PF) 1 MG/ML INJECTION SOLUTION
Freq: Once | INTRAMUSCULAR | Status: DC | PRN
Start: 2021-10-20 — End: 2021-10-20
  Administered 2021-10-20: 2 mg via INTRAVENOUS

## 2021-10-20 MED ORDER — DEXTROSE 5 % IN WATER (D5W) INTRAVENOUS SOLUTION
2.0000 g | Freq: Once | INTRAVENOUS | Status: AC
Start: 2021-10-20 — End: 2021-10-20
  Administered 2021-10-20: 09:00:00 2 g via INTRAVENOUS

## 2021-10-20 MED ORDER — KETOROLAC 30 MG/ML (1 ML) INJECTION SOLUTION
Freq: Once | INTRAMUSCULAR | Status: DC | PRN
Start: 2021-10-20 — End: 2021-10-20
  Administered 2021-10-20: 30 mg via INTRAVENOUS

## 2021-10-20 MED ORDER — MIDAZOLAM 1 MG/ML INJECTION SOLUTION
INTRAMUSCULAR | Status: AC
Start: 2021-10-20 — End: 2021-10-20
  Filled 2021-10-20: qty 2

## 2021-10-20 MED ORDER — PROPOFOL 10 MG/ML INTRAVENOUS EMULSION
INTRAVENOUS | Status: AC
Start: 2021-10-20 — End: 2021-10-20
  Filled 2021-10-20: qty 50

## 2021-10-20 MED ORDER — PROPOFOL 10 MG/ML INTRAVENOUS EMULSION
INTRAVENOUS | Status: DC | PRN
Start: 2021-10-20 — End: 2021-10-20
  Administered 2021-10-20: 100 ug/kg/min via INTRAVENOUS
  Administered 2021-10-20: 150 ug/kg/min via INTRAVENOUS
  Administered 2021-10-20: 0 ug/kg/min via INTRAVENOUS

## 2021-10-20 SURGICAL SUPPLY — 61 items
ADHESIVE TISSUE EXOFIN 1.0ML_PREMIERPRO EXOFIN (SUTURE/WOUND CLOSURE)
ARMBOARD IV FM PSTNR (IV TUBING & ACCESSORIES) ×2
ARMBRD POSITION 20X8X2IN DVN FOAM (IV TUBING & ACCESSORIES) ×2 IMPLANT
BASIN 473CC UTIL GRAD PEEL PCH_STRL BLU OR (MED SURG SUPPLIES) ×1
BASIN UTIL GRAD STRL BLU OR C16OZ (MED SURG SUPPLIES) ×1 IMPLANT
CONV USE 110625 - SUPPORT ATHL ELAS WB KNIT PCH TUB LEG STRAP HAND WSHBL LTX (WOUND CARE SUPPLY) ×1 IMPLANT
CONV USE 110626 - SUPPORT ATHL 18.3X11.3IN 39-44IN LRG ELAS WB KNIT PCH TUB LEG STRAP ADULT MALE (WOUND CARE SUPPLY) IMPLANT
CONV USE 23866 - NEEDLE HYPO 27GA 1.5IN STD MONOJECT SS POLYPROP REG BVL LL HUB UL SHRP ANTICORE YW STRL LF  DISP (MED SURG SUPPLIES) ×1 IMPLANT
CONV USE 320027 - CHILDRENS USE 320025 - KIT RM TURNOVER CSTM NONST LF (DRAPE/PACKS/SHEETS/OR TOWEL) ×1
CONV USE 320027 - CHILDRENS USE 320025 - KIT RM TURNOVER CUSTOM NONST LF (DRAPE/PACKS/SHEETS/OR TOWEL) ×1 IMPLANT
CONV USE ITEM 156524 - ADHESIVE TISSUE EXOFIN 1.0ML_PREMIERPRO EXOFIN (SUTURE/WOUND CLOSURE) IMPLANT
CONV USE ITEM 337890 - PACK SURG BSIN 2 STRL LF  DISP (CUSTOM TRAYS & PACK) IMPLANT
CONV USE ITEM 337905 - KIT SURG MIN STUP STRL DISP LF (CUSTOM TRAYS & PACK) IMPLANT
CONV USE ITEM 400323 - SUPPORTER ATHLETIC XL 44-50_101TXL (WOUND CARE SUPPLY) IMPLANT
CONV USE ITEM 91465 - SUTURE CHR 3-0 FS2 27IN BRN MONOF ABS (SUTURE/WOUND CLOSURE) ×1 IMPLANT
COVER WAND RFD STRL 50EA/CS_01-0020 (DRAPE/PACKS/SHEETS/OR TOWEL)
COVER WND RF DETECT STRL CLR EQP (DRAPE/PACKS/SHEETS/OR TOWEL) IMPLANT
DISC USE ITEM 307130 - GOWN SURG XL XLNG AAMI L4 IMPR_V BRTHBL HKLP CLSR STRL LF (DRAPE/PACKS/SHEETS/OR TOWEL) ×1
DISCONTINUED SUB PENDING - BASIN UTIL GRAD STRL BLU OR C16OZ (MED SURG SUPPLIES) ×1 IMPLANT
DISCONTINUED USE ITEM 307130 - GOWN SURG XL XLNG AAMI L4 IMPR_V BRTHBL HKLP CLSR STRL LF (DRAPE/PACKS/SHEETS/OR TOWEL) ×1
DISCONTINUED USE ITEM 342873 - GOWN SURG XL XLNG AAMI L4 IMPR_V BRTHBL HKLP CLSR STRL LF (DRAPE/PACKS/SHEETS/OR TOWEL) ×1 IMPLANT
DRESS PETRO 8X1IN CURAD XR COTTON GAUZE NADH OCL IMPREGNATE LF  STRL WHT (WOUND CARE SUPPLY) ×1 IMPLANT
DRESS PETRO 8X1IN CURAD XR COT_TON GAUZE NADH OCL IMPREGNATE (WOUND CARE/ENTEROSTOMAL SUPPLY) ×1
DRESSING NON-ADHERENT 2X3_STRL (WOUND CARE/ENTEROSTOMAL SUPPLY) ×1
DUPE USE ITEM 319424 - SUTURE SILK 2-0 PERMAHAND 24IN_BLK BRD TIE NONAB (SUTURE/WOUND CLOSURE) ×1 IMPLANT
ELECTRODE ESURG BLADE PNCL 10FT VLAB STRL SS DISP BUTTON SWH HEX LOCK CORD HLSTR LF  ACPT 3/32IN STD (SURGICAL CUTTING SUPPLIES) ×1 IMPLANT
ELECTRODE ESURG BLADE PNCL 10F_T VLAB STRL SS DISP BUTTON SWH (CUTTING ELEMENTS) ×1
ELECTRODE ESURG NEEDLE 2.84IN_3/32IN VLAB STRL SS 1.1IN DISP (CUTTING ELEMENTS) ×1
ELECTRODE ESURG NEEDLE 2.8IN 3/32IN VLAB STRL SS 1.1IN DISP STD SHAFT LF (SURGICAL CUTTING SUPPLIES) ×1 IMPLANT
GARMENT COMPRESS MED CALF CENTAURA NYL VASOGRAD LTWT BRTHBL SEQ FIL BLU 18- IN (MED SURG SUPPLIES) IMPLANT
GARMENT COMPRESS MED CALF CENT_AURA NYL VASOGRAD LTWT BRTHBL (MED SURG SUPPLIES)
GOWN SURG XL L3 NONREINFORCE HKLP CLSR SET IN SLEEVE STRL LF  DISP BLU SIRUS SMS 47IN (DRAPE/PACKS/SHEETS/OR TOWEL) ×1 IMPLANT
GOWN SURG XL L3 NONREINFORCE H_KLP CLSR SET IN SLEEVE STRL LF (DRAPE/PACKS/SHEETS/OR TOWEL) ×2
GOWN SURG XL XLNG AAMI L4 IMPR_V BRTHBL HKLP CLSR STRL LF (DRAPE/PACKS/SHEETS/OR TOWEL) ×1
HANDPC SUCT MEDIVAC YANKAUER BLBS TIP CLR STRL LF  DISP (MED SURG SUPPLIES) IMPLANT
HANDPC SUCT MEDIVAC YANKAUER B_LBS TIP CLR STRL LF DISP (MED SURG SUPPLIES)
KIT MINOR SURG SET UP_CS/28 (CUSTOM TRAYS & PACK)
KIT ROOM TURNOVER ~~LOC~~ CUSTOM (DRAPE/PACKS/SHEETS/OR TOWEL) ×1
KIT SURG MIN STUP STRL DISP LF (CUSTOM TRAYS & PACK)
LABEL E-Z STICK_STLEZP1 100EA/CS (MED SURG SUPPLIES) ×1
LABEL MED EZ PEEL MRKR LF (MED SURG SUPPLIES) ×1 IMPLANT
NEEDLE HYPO 27GA 1.5IN STD MO_NOJECT SS POLYPROP REG BVL LL (MED SURG SUPPLIES) ×1
PACK BASIN DBL CUSTOM (CUSTOM TRAYS & PACK)
PACK LAPAROTOMY_DYNJP3030S 4/CS (CUSTOM TRAYS & PACK) ×1
PACK SURG SIRUS LAP IV STRL LF (CUSTOM TRAYS & PACK) ×1 IMPLANT
PAD DRESS 3X2IN NONADH DRY ABS SFT LF  STRL DISP (WOUND CARE SUPPLY) ×1 IMPLANT
SPONGE GAUZE 6.75X6IN COTTON SUP FLF DIAMOND FOLD LOFT WOVEN LF  STRL DISP (WOUND CARE SUPPLY) ×1 IMPLANT
SPONGE SUPER 6X6.75 10/TR_STRL 10/TR (WOUND CARE/ENTEROSTOMAL SUPPLY) ×1
STRAP POSITION KNEE FOAM SFT ADJ CNTCT CLSR LF (MED SURG SUPPLIES) ×1 IMPLANT
STRAP POSITION KNEE FOAM SFT A_DJ CNTCT CLSR LF (MED SURG SUPPLIES) ×1
SUPPORT ATHL 18.3X11.3IN 33-38_IN MED ELAS WB KNIT PCH TUB (WOUND CARE/ENTEROSTOMAL SUPPLY) ×2
SUPPORT ATHL 18.3X11.3IN 39-44_IN LRG ELAS WB KNIT PCH TUB (WOUND CARE/ENTEROSTOMAL SUPPLY)
SUPPORTER ATHLETIC XL 44-50_101TXL (WOUND CARE/ENTEROSTOMAL SUPPLY)
SUTURE CHR GUT 3-0 FS2 27IN MO_NOF ABS (SUTURE/WOUND CLOSURE) ×1
SUTURE SILK 2-0 PERMAHAND 24IN_BLK BRD TIE NONAB (SUTURE/WOUND CLOSURE) ×1
SYRINGE BD LL 10ML LF STRL CO_NTROL CONCEN TIP PRGN FREE (MED SURG SUPPLIES) ×1
SYRINGE LL 10ML LF  STRL CONTROL CONCEN TIP PRGN FREE DEHP-FR MED DISP (MED SURG SUPPLIES) ×1 IMPLANT
TRAY SKIN SCRUB 8IN VNYL COTTON 6 WNG 6 SPONGE STICK 2 TIP APPL DRY STRL LF (MED SURG SUPPLIES) ×1 IMPLANT
TRAY SURG PREP SCR CR ESTM (MED SURG SUPPLIES) ×1
TUBING SUCT CLR 20FT 9/32IN MEDIVAC NCDTV M/M CONN STRL LF (MED SURG SUPPLIES) IMPLANT
TUBING SUCT CONN 20FT LONG_STRL N720A (MED SURG SUPPLIES)

## 2021-10-20 NOTE — Progress Notes (Signed)
[facility]  Department of Surgery  Discharge Day Note    Joseph Russo  Date of service: 10/20/2021  Date of Admission:  10/20/2021  Hospital Day:  LOS: 0 days     Examination at discharge:   Patient doing well post-procedure.   VS stable, patient feeling well without complaints.  Condition: Stable    Vital Signs: Temperature: 36.2 C (97.2 F) (10/20/21 0920)  Heart Rate: 56 (10/20/21 0920)  BP (Non-Invasive): (!) 90/50 (10/20/21 0920)  Respiratory Rate: 14 (10/20/21 0920)  SpO2: 99 % (10/20/21 0920)  General: No distress    Assessment:   30 y.o. adult s/p vasectomy    Disposition/Plan :   The patient will be discharged to home in stable condition. He was prescribed Norco 5/325 take 1-2 every 6 hours prn pain. #12 No refills.  Keflex 500 mg tid for 5 days. #15 No refills.  He was advised to keep his testicles elevated by wearing the scrotal supporter for 2 weeks. No sexual intercourse for 7 days.  He was instructed to avoid strenuous or vigorous physical activities for 2 weeks.  He was also advised to use barrier contraception for the next 8 weeks until the sperm count is zero in his semen specimen. He was instructed to submit a fresh semen specimen to the laboratory in 8 weeks for simple semen analysis.  He was also instructed to go to his nearest hospital ER for intractable pain, excessive scrotal swelling or enlargement, bleeding from his wounds, N/V or high fever.    [name], MD  10/20/2021, 09:21    ATTENDING POST-OP NOTE    Patient did well post-procedure.   VS stable, patient feeling well without complaints.  Condition: Stable  Disposition: Discharge to home today. Plan as above.    Avie Arenas, MD  10/20/2021, 09:50  Avie Arenas, Montez Hageman., MD, FACS  Associate Professor of Urology  [facility] Department of Urology

## 2021-10-20 NOTE — Nursing Note (Signed)
Message from Creta Levin sent at 10/20/2021 12:51 PM EDT    "F" patient calling with questions about bandages, etc S/P vasectomy today.        Call History     Type Contact Phone/Fax User   10/20/2021 12:50 PM EDT Phone (Incoming) Karen Kitchens, Claire "Glenis Smoker" (Self) [phone] Judie Petit) Creta Levin         Spoke with the patient to inform him to keep the fluff dressing on his scrotum for a full 24 hours and then he can remove the dressing.  He is to wear a scrotal support for 2 weeks.  The patient verbalized understanding of all information provided and offers no questions at this time.     Nickola Major, RN  10/20/2021, 15:00

## 2021-10-20 NOTE — Anesthesia Postprocedure Evaluation (Signed)
Anesthesia Post Op Evaluation    Patient: Joseph Russo  Procedure(s):  VASECTOMY    Last Vitals:Temperature: 36.4 C (97.5 F) (10/20/21 0945)  Heart Rate: 68 (10/20/21 0945)  BP (Non-Invasive): 113/76 (10/20/21 0945)  Respiratory Rate: 14 (10/20/21 0945)  SpO2: 98 % (10/20/21 0945)    No notable events documented.    Patient is sufficiently recovered from the effects of anesthesia to participate in the evaluation and has returned to their pre-procedure level.  Patient location during evaluation: PACU       Patient participation: complete - patient participated  Level of consciousness: awake and alert and responsive to verbal stimuli  Multimodal Pain Management: Multimodal analgesia used between 6 hours prior to anesthesia start to PACU discharge and Medical reason for not using multimodal pain management  Pain score: 1  Pain management: adequate  Airway patency: patent    Anesthetic complications: no  Cardiovascular status: acceptable  Respiratory status: acceptable  Hydration status: acceptable  Patient post-procedure temperature: Pt Normothermic   PONV Status: Absent

## 2021-10-20 NOTE — Nurses Notes (Signed)
Patient discharged home with family.  AVS reviewed with patient/care giver.  A written copy of the AVS and discharge instructions was given to the patient/care giver.  Questions sufficiently answered as needed.  Patient/care giver encouraged to follow up with PCP as indicated.  In the event of an emergency, patient/care giver instructed to call 911 or go to the nearest emergency room.       Vela Prose, RN  10/20/2021, 10:10

## 2021-10-20 NOTE — Discharge Instructions (Addendum)
SURGICAL DISCHARGE INSTRUCTIONS     Dr. Fooks, Henry, MD  performed your VASECTOMY today at the [facility]    [facility]:  Monday through Friday from 6 a.m. - 7 p.m.: [phone]  Between 7 p.m. - 6 a.m., weekends and holidays:  Call [facility] at [phone] or [phone].    PLEASE SEE WRITTEN HANDOUTS AS DISCUSSED BY YOUR NURSE:      SIGNS AND SYMPTOMS OF A WOUND / INCISION INFECTION   Be sure to watch for the following:   Increase in redness or red streaks near or around the wound or incision.   Increase in pain that is intense or severe and cannot be relieved by the pain medication that your doctor has given you.   Increase in swelling that cannot be relieved by elevation of a body part, or by applying ice, if permitted.   Increase in drainage, or if yellow / green in color and smells bad. This could be on a dressing or a cast.   Increase in fever for longer than 24 hours, or an increase that is higher than 101 degrees Fahrenheit (normal body temperature is 98 degrees Fahrenheit). The incision may feel warm to the touch.    **CALL YOUR DOCTOR IF ONE OR MORE OF THESE SIGNS / SYMPTOMS SHOULD OCCUR.    ANESTHESIA INFORMATION   ANESTHESIA -- ADULT PATIENTS:  You have received intravenous sedation / general anesthesia, and you may feel drowsy and light-headed for several hours. You may even experience some forgetfulness of the procedure. DO NOT DRIVE A MOTOR VEHICLE or perform any activity requiring complete alertness or coordination until you feel fully awake in about 24-48 hours. Do not drink alcoholic beverages for at least 24 hours. Do not stay alone, you must have a responsible adult available to be with you. You may also experience a dry mouth or nausea for 24 hours. This is a normal side effect and will disappear as the effects of the medication wear off.    REMEMBER   If you experience any difficulty breathing, chest pain, bleeding that you feel is excessive,  persistent nausea or vomiting or for any other concerns:  Call your physician Dr. Fooks at [phone] or [phone]. You may also ask to have the  doctor on call paged. They are available to you 24 hours a day.    SPECIAL INSTRUCTIONS / COMMENTS       FOLLOW-UP APPOINTMENTS   Please call patient services at [phone] or [phone] to schedule a date / time of return. They are open Monday - Friday from 7:30 am - 5:00 pm.

## 2021-10-20 NOTE — OR Surgeon (Addendum)
.[place] SUMMARY    Patient Name: Joseph Russo Number: [id number]  Date of Service: 10/20/2021  Date of Birth: 05/31/1991    Pre-Operative Diagnosis: Desire for elective sterilization     Post-Operative Diagnosis: Same    Procedure(s)/Description:  Elective vasectomy (bilateral)    Surgeons: Thurmon Fair, MD  Resident(s): Karma Greaser, MD Alyce Pagan, MD (TA)    Anesthesia Type: MAC  Local anesthetic: Lidocaine 2% plain  Estimated Blood Loss:  Minimal  Blood Given: None  Fluids Given: Per anesthesia report  Complications:  None  Tubes: None  Drains: None    Specimens: Bilateral segments of vas deferens to pathology lab.           Indications and informed consent for procedure:  Patient is a pleasant 30 y.o. adult with a desire to undergo elective sterilization.  The risks and complications of vasectomy were explained in detail along with possible risks and complications to include but not limited to bleeding/hematoma, infection, swelling, bruising, acute and chronic pain (post-vasectomy pain syndrome), and the remote chance of spontaneous reversal, quoted at approximately 1:2000. Possible long-term complications to include but not limited to heart disease, prostate cancer, and other illnesses were also mentioned, as well as the numerous long-term large volume studies, indicating a minimal risk of increase of any other disease process. The absolute necessity for continued contraception following vasectomy was explained and the need for post-vasectomy simple semen analysis at approximately 6-8 weeks (or 20 ejaculates) with no sperm in the sample prior to unprotected intercourse was explained. Patient verbalized understanding and agreed to having elective vasectomy. All questions were answered.    Operative Findings: Bilateral segments of normal vas deferens resected and sent to pathology lab.    Description of Procedure:  After informed  consent was obtained the patient was brought back to the operating room and placed in the supine position. MAC was induced by our anesthesia colleagues. His genitalia were prepped and draped in the standard sterile fashion. Attention was turned to the left hemiscrotum where the vas deferens was isolated between the fingers. Lidocaine 2% plain was used as a local anesthetic.  The vas was grasped and vas clamp and the needle nosed forceps were used to make an incision over the vas deferens. Using sharp and blunt dissection the vas was isolated and ~ 1.5 cm segment was resected. Each end was cauterized with the Bovie electrocautery unit and then ligated with a 2-0 silk tie. Hemostasis was maintained throughout. The incision was closed with a 3-0 chromic suture.  The same procedure was carried out on the contralateral side. The procedure was then terminated. The patient tolerated the procedure well without complications. Bacitracin ointment were applied to the wounds and a strip of xeroform gauze was placed. Two packs of fluffs were used to provide a mild "beehive" pressure dressing. A large scrotal supporter was placed on the patient.  Dr. Matthew Folks was present, participated and supervised the entire procedure.    Disposition:  The patient will be discharged to home in stable condition. He was prescribed Norco 5/325 take 1-2 every 6 hours prn pain. #12 No refills.  Keflex 500 mg tid for 5 days. #15 No refills.  He was advised to keep his testicles elevated by wearing the scrotal supporter for 2 weeks. No sexual intercourse for 7 days.  He was instructed to avoid strenuous or vigorous physical activities for 2 weeks.  He was also advised to  use barrier contraception for the next 8 weeks until the sperm count is zero in his semen specimen. He was instructed to submit a fresh semen specimen to the laboratory in 8 weeks for simple semen analysis.  He was also instructed to go to his nearest hospital ER for intractable pain,  excessive scrotal swelling or enlargement, bleeding from his wounds, N/V or high fever.    Karma Greaser, MD   [facility]  Department of Urology   10/20/2021, 09:20     I was present, participated and supervised the entire procedure. I concur with the above resident dictation.  Thurmon Fair, MD  10/20/2021, 09:49  Raymond Gurney., MD, FACS  Associate Professor of Urology  [facility] Department of Urology

## 2021-10-20 NOTE — Anesthesia Transfer of Care (Signed)
ANESTHESIA TRANSFER OF CARE   Joseph Russo is a 30 y.o. ,adult, Weight: 54.4 kg (119 lb 14.9 oz)   had Procedure(s):  VASECTOMY  performed  10/20/21   Primary Service: Avie Arenas, MD    Past Medical History:   Diagnosis Date   . Anxiety    . Chronic pain    . History of body piercing     "Mouth and septum, ear gauges"    . History of MRSA infection     "3 years ago" reported 09/05/2021   . Tattoos     "90% torso tattooed"       Allergy History as of 10/20/21     VENLAFAXINE       Noted Status Severity Type Reaction    09/05/21 1510 Lynnell Dike, [place] 09/05/21 Active       Comments: Patient unsure of reaction               I completed my transfer of care / handoff to the receiving personnel during which we discussed:   Last OR Temp: Temperature: 36.2 C (97.2 F)  ABG:   Airway:* No LDAs found *  Blood pressure (!) 90/50, pulse 56, temperature 36.2 C (97.2 F), resp. rate 14, height 1.7 m (5' 6.93"), weight 54.4 kg (119 lb 14.9 oz), SpO2 99 %.

## 2021-10-20 NOTE — Anesthesia Preprocedure Evaluation (Addendum)
ANESTHESIA PRE-OP EVALUATION  Planned Procedure: VASECTOMY  Review of Systems     anesthesia history negative     patient summary reviewed          Pulmonary   current smoker and Smoked in last 24 hours,   Cardiovascular     Exercise Tolerance: > or = 4 METS        GI/Hepatic/Renal           Endo/Other         Neuro/Psych/MS    anxiety     Cancer                      Physical Assessment      Airway       Mallampati: I    TM distance: >3 FB    Neck ROM: limited  Mouth Opening: good.  No Facial hair  No Beard        Dental       Dentition intact             Pulmonary    Breath sounds clear to auscultation       Cardiovascular    Rhythm: regular  Rate: Normal       Other findings            Plan  ASA 1     Planned anesthesia type: MAC                   PONV/POV Plan:  I plan to administer pharmcologic prophalaxis antiemetics  Intravenous induction     Anesthesia issues/risks discussed are: Dental Injuries, Post-op Agitation/Tantrum and Post-op Cognitive Dysfunction.  Anesthetic plan and risks discussed with patient and significant other  Signed consent obtained            Patient's NPO status is appropriate for Anesthesia.           Plan discussed with CRNA.    (R/B explained)

## 2021-10-20 NOTE — H&P (Signed)
[facility]  Division of Urologic Surgery   Pre-operative History and Physical    Joseph Russo  [id number]  Mar 28, 1991    CC: Desire for elective sterilization     HPI: Joseph Russo is a 30 y.o. adult with a desire for elective sterilization who presents for vasectomy. The patient's was last seen in clinic on 07/30/21. No new issues. She has 0 children and desires no more. She denies any history of bleeding problems. Has not taken any blood thinners including ASA, Plavix, and Coumadin in the past 7-10 days.     PMHx:   Past Medical History:   Diagnosis Date    Anxiety     Chronic pain     History of body piercing     "Mouth and septum, ear gauges"     History of MRSA infection     "3 years ago" reported 09/05/2021    Tattoos     "90% torso tattooed"           PSHx:   Past Surgical History:   Procedure Laterality Date    HX COLONOSCOPY  05/2021    INGUINAL HERNIA REPAIR      LASIK Bilateral     "[facility]"     UMBILICAL HERNIA REPAIR  08/19/2021    WRIST SURGERY Left     "staples"         Family Hx: Marland Kitchen  Family Medical History:    None         Social Hx:   Social History     Socioeconomic History    Marital status: Divorced     Spouse name: Not on file    Number of children: Not on file    Years of education: Not on file    Highest education level: Not on file   Occupational History    Not on file   Tobacco Use    Smoking status: Every Day     Packs/day: 0.50     Years: 14.00     Pack years: 7.00     Types: Cigarettes    Smokeless tobacco: Former     Types: Snuff, Chew    Tobacco comments:     4 cigarettes daily reported 09/05/2021     1 800 QUIT NOW   Vaping Use    Vaping Use: Former    Substances: Nicotine, THC, CBD, Flavoring    Devices: Disposable, Refillable tank   Substance and Sexual Activity    Alcohol use: Not Currently     Comment: very rare    Drug use: Not Currently     Types: Marijuana     Comment: "none in last 2 1/2 weeks"     Sexual activity: Not on file   Other Topics Concern    Ability  to Walk 1 Flight of Steps without SOB/CP Yes    Routine Exercise No     Comment:  not currently since hernia surgery, prior calestetics     Ability to Walk 2 Flight of Steps without SOB/CP Yes    Unable to Ambulate Not Asked    Total Care Not Asked    Ability To Do Own ADL's Yes    Uses Walker Not Asked    Other Activity Level Yes     Comment: housework prior to hernia surgery    Uses Cane Not Asked   Social History Narrative    Not on file     Social Determinants of Health  Financial Resource Strain: Not on file   Food Insecurity: Not on file   Transportation Needs: Not on file   Physical Activity: Not on file   Stress: Not on file   Intimate Partner Violence: Not on file   Housing Stability: Not on file     Medications:   No current outpatient medications on file.     Allergies:   Allergies   Allergen Reactions    Effexor [Venlafaxine]      Patient unsure of reaction       ROS:  All 10 systems were reviewed.  There were pertinent positives in: Genital urinary system  There are no new changes to the prior reviewed review of systems.    Physical Exam:  BP 96/70   Pulse 69   Temp 36.7 C (98.1 F)   Resp 16   Ht 1.7 m (5' 6.93")   Wt 54.4 kg (119 lb 14.9 oz)   SpO2 98%   BMI 18.82 kg/m       General - Alert/oriented; NAD  Neck - Supple, trachea midline  Lungs - Respirations are unlabored, good inspiratory effort  Abdomen - Non-distended   Musculoskeletal - FROM in all 4 extremities  Neurological - CN II-XII Grossly intact  GU system - Deferred. Plan to examine in OR    Assessment:  30 y.o. adult with a desire for elective sterilization    Plan:  The risks and complications of vasectomy were extensively explained. These include the possibility of bleeding, swelling, ecchymosis, scrotal hematoma formation, infection, acute and chronic pain (post vasectomy pain syndrome), and the remote chance of spontaneous reversal, quoted at approximately 1:2000. Possible long-term complications including heart disease,  prostate cancer, and other illnesses were also mentioned, as well as the numerous long-term large volume studies, indicating a minimal risk of increase of any other disease process. The absolute necessity for continued contraception following vasectomy was stressed, and the need for post-vasectomy semen analysis at approximately eight weeks (or 20 ejaculates) with no sperm in the sample prior to unprotected intercourse was emphasized.   To OR for vasectomy  Consent obtained  The patient understands the risks of the procedure, which include bleeding, infection, recurrence, damage to the surrounding structures, failure to correct pathology, loss of sensation, and the risks of anesthesia (heart attack, stroke, death, etc.)     All questions of patient and family answered    [name], MD   [facility] [place]  Division of Urology   10/20/2021, 08:08       I saw and examined the patient.  I reviewed the resident's note.  I agree with the findings and plan of care as documented in the resident's note.  Any exceptions/additions are edited/noted.    ATTENDING CONSENT NOTE    Elective vasectomy procedure explained in detail along with possible risks and complications to include but not limited to bleeding, infection, injury or resection of testicular blood vessels,  swelling, bruising, acute and chronic pain, anesthetic reaction, the remote possibility of spontaneous reversal (failed vasectomy) quoted at approximately 0.5%.  The risk of post-vasectomy pain syndrome, bleeding or infection is 1-2%. Elective vasectomy has no association with heart disease, prostate cancer or other diseases or illnesses in large volume studies. The absolute necessity for continued contraception following vasectomy was explained as well as the need for post-vasectomy simple semen analysis at 8 weeks with no sperm in the sample prior to unprotected intercourse was explained. Patient verbalized understanding and agreed to  the procedure. All questions were answered and he accepts all possible risks involved and wishes to proceed with elective vasectomy.     Avie Arenas, MD  Erven Colla., MD, FACS  Associate Professor of Urology  [facility] Department of Urology

## 2021-10-24 LAB — SURGICAL PATHOLOGY SPECIMEN

## 2021-12-23 ENCOUNTER — Other Ambulatory Visit: Payer: Self-pay

## 2022-10-12 ENCOUNTER — Other Ambulatory Visit: Payer: Self-pay

## 2022-11-10 ENCOUNTER — Emergency Department
Admission: EM | Admit: 2022-11-10 | Discharge: 2022-11-10 | Disposition: A | Discharge status: Home / Self Care | Payer: Non-veteran care | Attending: Family | Admitting: Family

## 2022-11-10 ENCOUNTER — Other Ambulatory Visit: Payer: Self-pay

## 2022-11-10 ENCOUNTER — Emergency Department (HOSPITAL_COMMUNITY): Payer: Non-veteran care

## 2022-11-10 DIAGNOSIS — F17210 Nicotine dependence, cigarettes, uncomplicated: Secondary | ICD-10-CM | POA: Insufficient documentation

## 2022-11-10 DIAGNOSIS — Y92410 Unspecified street and highway as the place of occurrence of the external cause: Secondary | ICD-10-CM | POA: Insufficient documentation

## 2022-11-10 DIAGNOSIS — M25512 Pain in left shoulder: Secondary | ICD-10-CM | POA: Insufficient documentation
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
## 2022-11-10 NOTE — Discharge Instructions (Signed)
Continue with rest, ice, heat  Tylenol, ibuprofen as needed  Orthopedics follow-up, you have been given a referral  PCP follow-up as needed  Return to the ED with any new, worsening or concerning symptoms.

## 2024-05-09 ENCOUNTER — Encounter (INDEPENDENT_AMBULATORY_CARE_PROVIDER_SITE_OTHER): Payer: Self-pay
